# Patient Record
Sex: Female | Born: 1935 | Race: White | Hispanic: No | Marital: Single | Smoking: Former smoker
Health system: Southern US, Community
[De-identification: ages and names within clinical notes are randomized; demographics above are authoritative.]

## PROBLEM LIST (undated history)

## (undated) DIAGNOSIS — D649 Anemia, unspecified: Secondary | ICD-10-CM

## (undated) DIAGNOSIS — E119 Type 2 diabetes mellitus without complications: Secondary | ICD-10-CM

## (undated) DIAGNOSIS — K219 Gastro-esophageal reflux disease without esophagitis: Secondary | ICD-10-CM

## (undated) DIAGNOSIS — J449 Chronic obstructive pulmonary disease, unspecified: Secondary | ICD-10-CM

## (undated) DIAGNOSIS — T7840XA Allergy, unspecified, initial encounter: Secondary | ICD-10-CM

## (undated) DIAGNOSIS — M1990 Unspecified osteoarthritis, unspecified site: Secondary | ICD-10-CM

## (undated) DIAGNOSIS — I10 Essential (primary) hypertension: Secondary | ICD-10-CM

## (undated) HISTORY — PX: OTHER SURGICAL HISTORY: SHX169

## (undated) HISTORY — PX: ANTERIOR AND POSTERIOR VAGINAL REPAIR: SUR5

## (undated) HISTORY — PX: ABDOMINAL HYSTERECTOMY: SHX81

---

## 2004-01-26 ENCOUNTER — Ambulatory Visit: Payer: Self-pay | Admitting: Unknown Physician Specialty

## 2004-02-07 ENCOUNTER — Other Ambulatory Visit: Payer: Self-pay

## 2004-02-07 ENCOUNTER — Inpatient Hospital Stay: Payer: Self-pay | Admitting: Internal Medicine

## 2004-02-08 ENCOUNTER — Other Ambulatory Visit: Payer: Self-pay

## 2004-02-09 ENCOUNTER — Other Ambulatory Visit: Payer: Self-pay

## 2004-03-03 ENCOUNTER — Ambulatory Visit: Payer: Self-pay | Admitting: Unknown Physician Specialty

## 2004-05-11 ENCOUNTER — Ambulatory Visit: Payer: Self-pay | Admitting: Urology

## 2004-05-18 ENCOUNTER — Ambulatory Visit: Payer: Self-pay | Admitting: Urology

## 2004-06-10 ENCOUNTER — Ambulatory Visit: Payer: Self-pay | Admitting: Urology

## 2004-07-09 ENCOUNTER — Ambulatory Visit: Payer: Self-pay | Admitting: Internal Medicine

## 2004-07-19 ENCOUNTER — Ambulatory Visit: Payer: Self-pay | Admitting: Internal Medicine

## 2004-08-12 ENCOUNTER — Ambulatory Visit: Payer: Self-pay | Admitting: Ophthalmology

## 2004-08-16 ENCOUNTER — Ambulatory Visit: Payer: Self-pay

## 2004-08-18 ENCOUNTER — Ambulatory Visit: Payer: Self-pay | Admitting: Ophthalmology

## 2004-12-23 ENCOUNTER — Ambulatory Visit: Payer: Self-pay | Admitting: Unknown Physician Specialty

## 2005-03-10 ENCOUNTER — Ambulatory Visit: Payer: Self-pay | Admitting: Internal Medicine

## 2005-05-02 ENCOUNTER — Inpatient Hospital Stay: Payer: Self-pay | Admitting: Internal Medicine

## 2005-09-12 ENCOUNTER — Ambulatory Visit: Payer: Self-pay | Admitting: Internal Medicine

## 2005-09-21 ENCOUNTER — Ambulatory Visit: Payer: Self-pay

## 2006-06-15 ENCOUNTER — Other Ambulatory Visit: Payer: Self-pay

## 2006-06-15 ENCOUNTER — Emergency Department: Payer: Self-pay | Admitting: Emergency Medicine

## 2006-06-16 ENCOUNTER — Other Ambulatory Visit: Payer: Self-pay

## 2006-09-03 ENCOUNTER — Other Ambulatory Visit: Payer: Self-pay

## 2006-09-03 ENCOUNTER — Emergency Department: Payer: Self-pay | Admitting: Internal Medicine

## 2006-10-18 ENCOUNTER — Ambulatory Visit: Payer: Self-pay | Admitting: Internal Medicine

## 2006-12-19 ENCOUNTER — Ambulatory Visit: Payer: Self-pay | Admitting: Internal Medicine

## 2007-09-07 ENCOUNTER — Ambulatory Visit: Payer: Self-pay | Admitting: Surgery

## 2008-01-04 ENCOUNTER — Inpatient Hospital Stay: Payer: Self-pay | Admitting: Internal Medicine

## 2008-02-13 ENCOUNTER — Ambulatory Visit: Payer: Self-pay | Admitting: Gastroenterology

## 2009-05-03 ENCOUNTER — Emergency Department: Payer: Self-pay | Admitting: Emergency Medicine

## 2009-05-07 ENCOUNTER — Observation Stay: Payer: Self-pay | Admitting: Internal Medicine

## 2010-10-03 ENCOUNTER — Inpatient Hospital Stay: Payer: Self-pay | Admitting: Specialist

## 2010-12-28 ENCOUNTER — Ambulatory Visit: Payer: Self-pay | Admitting: Ophthalmology

## 2011-01-05 ENCOUNTER — Ambulatory Visit: Payer: Self-pay | Admitting: Ophthalmology

## 2011-05-20 ENCOUNTER — Emergency Department: Payer: Self-pay | Admitting: Emergency Medicine

## 2011-05-20 LAB — URINALYSIS, COMPLETE
Bilirubin,UR: NEGATIVE
Blood: NEGATIVE
Leukocyte Esterase: NEGATIVE
Nitrite: NEGATIVE
RBC,UR: NONE SEEN /HPF (ref 0–5)
Specific Gravity: 1.015 (ref 1.003–1.030)
WBC UR: <1 /HPF (ref 0–5)

## 2011-05-20 LAB — CBC
HCT: 38.2 % (ref 35.0–47.0)
HGB: 12.9 g/dL (ref 12.0–16.0)
MCHC: 33.8 g/dL (ref 32.0–36.0)
Platelet: 139 10*3/uL — ABNORMAL LOW (ref 150–440)
RBC: 4.18 10*6/uL (ref 3.80–5.20)
RDW: 13.6 % (ref 11.5–14.5)

## 2011-05-20 LAB — COMPREHENSIVE METABOLIC PANEL
Albumin: 4.1 g/dL (ref 3.4–5.0)
Alkaline Phosphatase: 76 U/L (ref 50–136)
Anion Gap: 8 (ref 7–16)
BUN: 22 mg/dL — ABNORMAL HIGH (ref 7–18)
Calcium, Total: 8.8 mg/dL (ref 8.5–10.1)
Chloride: 105 mmol/L (ref 98–107)
Co2: 29 mmol/L (ref 21–32)
Creatinine: 1.19 mg/dL (ref 0.60–1.30)
EGFR (African American): 57 — ABNORMAL LOW
Glucose: 98 mg/dL (ref 65–99)
Osmolality: 286 (ref 275–301)
SGPT (ALT): 19 U/L

## 2011-05-20 LAB — TROPONIN I: Troponin-I: <0.02 ng/mL

## 2011-05-20 LAB — TSH: Thyroid Stimulating Horm: 0.143 u[IU]/mL — ABNORMAL LOW

## 2012-03-15 ENCOUNTER — Emergency Department: Payer: Self-pay | Admitting: Internal Medicine

## 2012-03-15 LAB — CK TOTAL AND CKMB (NOT AT ARMC)
CK, Total: 50 U/L (ref 21–215)
CK-MB: 1 ng/mL (ref 0.5–3.6)

## 2012-03-15 LAB — CBC
HCT: 38.6 % (ref 35.0–47.0)
MCV: 89 fL (ref 80–100)
Platelet: 158 10*3/uL (ref 150–440)
RBC: 4.36 10*6/uL (ref 3.80–5.20)

## 2012-03-15 LAB — BASIC METABOLIC PANEL
BUN: 20 mg/dL — ABNORMAL HIGH (ref 7–18)
Creatinine: 1.29 mg/dL (ref 0.60–1.30)
EGFR (Non-African Amer.): 41 — ABNORMAL LOW
Osmolality: 281 (ref 275–301)
Potassium: 4.7 mmol/L (ref 3.5–5.1)

## 2012-03-15 LAB — HEPATIC FUNCTION PANEL A (ARMC)
Albumin: 4 g/dL (ref 3.4–5.0)
Bilirubin, Direct: 0.1 mg/dL (ref 0.00–0.20)
Bilirubin,Total: 0.4 mg/dL (ref 0.2–1.0)
SGPT (ALT): 16 U/L (ref 12–78)

## 2012-03-15 LAB — TROPONIN I: Troponin-I: <0.02 ng/mL

## 2012-03-21 LAB — CULTURE, BLOOD (SINGLE)

## 2012-07-13 ENCOUNTER — Inpatient Hospital Stay: Payer: Self-pay | Admitting: Family Medicine

## 2012-07-13 LAB — URINALYSIS, COMPLETE
Bacteria: NONE SEEN
Bilirubin,UR: NEGATIVE
Ketone: NEGATIVE
Leukocyte Esterase: NEGATIVE
Protein: NEGATIVE
RBC,UR: 1 /HPF (ref 0–5)
Specific Gravity: 1.021 (ref 1.003–1.030)
Squamous Epithelial: 1
WBC UR: 1 /HPF (ref 0–5)

## 2012-07-13 LAB — CK TOTAL AND CKMB (NOT AT ARMC)
CK, Total: 50 U/L (ref 21–215)
CK, Total: 37 U/L (ref 21–215)
CK-MB: 0.8 ng/mL (ref 0.5–3.6)

## 2012-07-13 LAB — TROPONIN I
Troponin-I: <0.02 ng/mL
Troponin-I: 0.02 ng/mL
Troponin-I: <0.02 ng/mL

## 2012-07-13 LAB — CBC
MCH: 31.1 pg (ref 26.0–34.0)
MCHC: 34.1 g/dL (ref 32.0–36.0)
MCV: 91 fL (ref 80–100)
RBC: 4.05 10*6/uL (ref 3.80–5.20)
WBC: 5.4 10*3/uL (ref 3.6–11.0)

## 2012-07-13 LAB — BASIC METABOLIC PANEL
Calcium, Total: 8.5 mg/dL (ref 8.5–10.1)
Co2: 29 mmol/L (ref 21–32)
Sodium: 143 mmol/L (ref 136–145)

## 2012-07-13 LAB — PROTIME-INR: INR: 1

## 2012-07-13 LAB — PRO B NATRIURETIC PEPTIDE: B-Type Natriuretic Peptide: 691 pg/mL — ABNORMAL HIGH (ref 0–125)

## 2012-07-14 LAB — CBC WITH DIFFERENTIAL/PLATELET
Basophil %: 0.2 %
Eosinophil #: 0 10*3/uL (ref 0.0–0.7)
Eosinophil %: 0 %
HGB: 10.6 g/dL — ABNORMAL LOW (ref 12.0–16.0)
MCV: 91 fL (ref 80–100)
Monocyte #: 0.3 x10 3/mm (ref 0.2–0.9)
Monocyte %: 2.4 %
Neutrophil #: 11.6 10*3/uL — ABNORMAL HIGH (ref 1.4–6.5)
Neutrophil %: 93.7 %
Platelet: 111 10*3/uL — ABNORMAL LOW (ref 150–440)
RDW: 14 % (ref 11.5–14.5)

## 2012-07-14 LAB — BASIC METABOLIC PANEL
Anion Gap: 3 — ABNORMAL LOW (ref 7–16)
Chloride: 109 mmol/L — ABNORMAL HIGH (ref 98–107)
Co2: 29 mmol/L (ref 21–32)
EGFR (Non-African Amer.): 47 — ABNORMAL LOW
Osmolality: 287 (ref 275–301)
Potassium: 4.4 mmol/L (ref 3.5–5.1)
Sodium: 141 mmol/L (ref 136–145)

## 2012-07-16 LAB — BASIC METABOLIC PANEL
Anion Gap: 6 — ABNORMAL LOW (ref 7–16)
BUN: 40 mg/dL — ABNORMAL HIGH (ref 7–18)
Calcium, Total: 8.8 mg/dL (ref 8.5–10.1)
Co2: 29 mmol/L (ref 21–32)
EGFR (African American): 43 — ABNORMAL LOW
EGFR (Non-African Amer.): 37 — ABNORMAL LOW
Glucose: 130 mg/dL — ABNORMAL HIGH (ref 65–99)

## 2012-07-17 LAB — BASIC METABOLIC PANEL
Anion Gap: 3 — ABNORMAL LOW (ref 7–16)
BUN: 43 mg/dL — ABNORMAL HIGH (ref 7–18)
Calcium, Total: 8.5 mg/dL (ref 8.5–10.1)
Chloride: 105 mmol/L (ref 98–107)
EGFR (African American): 41 — ABNORMAL LOW
Glucose: 199 mg/dL — ABNORMAL HIGH (ref 65–99)
Osmolality: 294 (ref 275–301)
Potassium: 3.9 mmol/L (ref 3.5–5.1)
Sodium: 139 mmol/L (ref 136–145)

## 2012-07-19 LAB — CULTURE, BLOOD (SINGLE)

## 2012-07-31 ENCOUNTER — Ambulatory Visit: Payer: Self-pay | Admitting: Physical Medicine and Rehabilitation

## 2012-10-31 ENCOUNTER — Emergency Department: Payer: Self-pay | Admitting: Emergency Medicine

## 2012-10-31 LAB — COMPREHENSIVE METABOLIC PANEL
Albumin: 3.6 g/dL (ref 3.4–5.0)
Anion Gap: 8 (ref 7–16)
BUN: 28 mg/dL — ABNORMAL HIGH (ref 7–18)
Bilirubin,Total: 0.2 mg/dL (ref 0.2–1.0)
Calcium, Total: 8.6 mg/dL (ref 8.5–10.1)
Chloride: 107 mmol/L (ref 98–107)
Creatinine: 1.66 mg/dL — ABNORMAL HIGH (ref 0.60–1.30)
EGFR (African American): 35 — ABNORMAL LOW
EGFR (Non-African Amer.): 30 — ABNORMAL LOW
Glucose: 142 mg/dL — ABNORMAL HIGH (ref 65–99)
Potassium: 4.1 mmol/L (ref 3.5–5.1)
SGOT(AST): 21 U/L (ref 15–37)
SGPT (ALT): 16 U/L (ref 12–78)
Sodium: 141 mmol/L (ref 136–145)
Total Protein: 6.4 g/dL (ref 6.4–8.2)

## 2012-10-31 LAB — TROPONIN I
Troponin-I: <0.02 ng/mL
Troponin-I: <0.02 ng/mL

## 2012-10-31 LAB — CK TOTAL AND CKMB (NOT AT ARMC): CK, Total: 54 U/L (ref 21–215)

## 2012-10-31 LAB — PRO B NATRIURETIC PEPTIDE: B-Type Natriuretic Peptide: 265 pg/mL — ABNORMAL HIGH (ref 0–125)

## 2012-10-31 LAB — CBC
HGB: 12.3 g/dL (ref 12.0–16.0)
MCH: 30.8 pg (ref 26.0–34.0)
RBC: 3.98 10*6/uL (ref 3.80–5.20)
WBC: 5.2 10*3/uL (ref 3.6–11.0)

## 2012-12-18 ENCOUNTER — Inpatient Hospital Stay: Payer: Self-pay | Admitting: Internal Medicine

## 2012-12-18 LAB — BASIC METABOLIC PANEL
BUN: 27 mg/dL — ABNORMAL HIGH (ref 7–18)
Calcium, Total: 9.6 mg/dL (ref 8.5–10.1)
Co2: 27 mmol/L (ref 21–32)
Osmolality: 287 (ref 275–301)
Potassium: 5 mmol/L (ref 3.5–5.1)

## 2012-12-18 LAB — CBC
HCT: 41.4 % (ref 35.0–47.0)
HGB: 13.9 g/dL (ref 12.0–16.0)
MCH: 30.5 pg (ref 26.0–34.0)
MCHC: 33.5 g/dL (ref 32.0–36.0)
WBC: 14.4 10*3/uL — ABNORMAL HIGH (ref 3.6–11.0)

## 2012-12-18 LAB — TROPONIN I: Troponin-I: <0.02 ng/mL

## 2012-12-19 LAB — CBC WITH DIFFERENTIAL/PLATELET
Basophil %: 0.3 %
Eosinophil #: 0.1 10*3/uL (ref 0.0–0.7)
Eosinophil %: 0.8 %
HCT: 29.2 % — ABNORMAL LOW (ref 35.0–47.0)
HGB: 10 g/dL — ABNORMAL LOW (ref 12.0–16.0)
Lymphocyte %: 8.8 %
MCH: 30.7 pg (ref 26.0–34.0)
MCV: 90 fL (ref 80–100)
Monocyte %: 6.2 %
Neutrophil %: 83.9 %
Platelet: 96 10*3/uL — ABNORMAL LOW (ref 150–440)
RDW: 13.6 % (ref 11.5–14.5)
WBC: 8.3 10*3/uL (ref 3.6–11.0)

## 2012-12-19 LAB — BASIC METABOLIC PANEL
Anion Gap: 4 — ABNORMAL LOW (ref 7–16)
Calcium, Total: 8.2 mg/dL — ABNORMAL LOW (ref 8.5–10.1)
Co2: 27 mmol/L (ref 21–32)
Creatinine: 1.77 mg/dL — ABNORMAL HIGH (ref 0.60–1.30)
EGFR (Non-African Amer.): 28 — ABNORMAL LOW
Glucose: 107 mg/dL — ABNORMAL HIGH (ref 65–99)
Osmolality: 280 (ref 275–301)
Sodium: 136 mmol/L (ref 136–145)

## 2012-12-19 LAB — URINALYSIS, COMPLETE
Bilirubin,UR: NEGATIVE
Blood: NEGATIVE
Ketone: NEGATIVE
Leukocyte Esterase: NEGATIVE
Protein: NEGATIVE
RBC,UR: <1 /HPF (ref 0–5)
Specific Gravity: 1.006 (ref 1.003–1.030)
Squamous Epithelial: 1
WBC UR: 2 /HPF (ref 0–5)

## 2012-12-20 LAB — CREATININE, SERUM
Creatinine: 1.62 mg/dL — ABNORMAL HIGH (ref 0.60–1.30)
EGFR (African American): 36 — ABNORMAL LOW

## 2012-12-20 LAB — CBC WITH DIFFERENTIAL/PLATELET
Basophil %: 0.6 %
HGB: 10.4 g/dL — ABNORMAL LOW (ref 12.0–16.0)
MCH: 30.9 pg (ref 26.0–34.0)
MCHC: 34.4 g/dL (ref 32.0–36.0)
MCV: 90 fL (ref 80–100)
Monocyte %: 5.8 %
Neutrophil %: 81.9 %
Platelet: 122 10*3/uL — ABNORMAL LOW (ref 150–440)

## 2012-12-22 LAB — MAGNESIUM: Magnesium: 1.6 mg/dL — ABNORMAL LOW

## 2012-12-23 LAB — CULTURE, BLOOD (SINGLE)

## 2013-01-25 ENCOUNTER — Ambulatory Visit: Payer: Self-pay | Admitting: Family

## 2013-03-02 ENCOUNTER — Observation Stay: Payer: Self-pay | Admitting: Internal Medicine

## 2013-03-02 LAB — CBC
HCT: 36.3 % (ref 35.0–47.0)
HGB: 12.5 g/dL (ref 12.0–16.0)
MCH: 30.2 pg (ref 26.0–34.0)
MCHC: 34.3 g/dL (ref 32.0–36.0)
MCV: 88 fL (ref 80–100)
PLATELETS: 140 10*3/uL — AB (ref 150–440)
RBC: 4.12 10*6/uL (ref 3.80–5.20)
RDW: 13.9 % (ref 11.5–14.5)
WBC: 5.5 10*3/uL (ref 3.6–11.0)

## 2013-03-02 LAB — COMPREHENSIVE METABOLIC PANEL
ALBUMIN: 3.7 g/dL (ref 3.4–5.0)
ALT: 16 U/L (ref 12–78)
Alkaline Phosphatase: 67 U/L
Anion Gap: 4 — ABNORMAL LOW (ref 7–16)
BUN: 24 mg/dL — ABNORMAL HIGH (ref 7–18)
Bilirubin,Total: 0.8 mg/dL (ref 0.2–1.0)
CHLORIDE: 104 mmol/L (ref 98–107)
Calcium, Total: 8.8 mg/dL (ref 8.5–10.1)
Co2: 30 mmol/L (ref 21–32)
Creatinine: 1.56 mg/dL — ABNORMAL HIGH (ref 0.60–1.30)
EGFR (African American): 37 — ABNORMAL LOW
EGFR (Non-African Amer.): 32 — ABNORMAL LOW
GLUCOSE: 92 mg/dL (ref 65–99)
OSMOLALITY: 279 (ref 275–301)
Potassium: 4 mmol/L (ref 3.5–5.1)
SGOT(AST): 9 U/L — ABNORMAL LOW (ref 15–37)
Sodium: 138 mmol/L (ref 136–145)
Total Protein: 6.6 g/dL (ref 6.4–8.2)

## 2013-03-02 LAB — TROPONIN I: Troponin-I: <0.02 ng/mL

## 2013-03-02 LAB — CK TOTAL AND CKMB (NOT AT ARMC)
CK, Total: 50 U/L (ref 21–215)
CK, Total: 64 U/L (ref 21–215)
CK-MB: 1.1 ng/mL (ref 0.5–3.6)
CK-MB: 0.9 ng/mL (ref 0.5–3.6)

## 2013-03-02 LAB — URINALYSIS, COMPLETE
BILIRUBIN, UR: NEGATIVE
Glucose,UR: NEGATIVE mg/dL (ref 0–75)
KETONE: NEGATIVE
Nitrite: NEGATIVE
PH: 5 (ref 4.5–8.0)
Protein: NEGATIVE
RBC,UR: 7 /HPF (ref 0–5)
Specific Gravity: 1.017 (ref 1.003–1.030)
WBC UR: 11 /HPF (ref 0–5)

## 2013-03-02 LAB — HEMOGLOBIN A1C: HEMOGLOBIN A1C: 5.8 % (ref 4.2–6.3)

## 2013-03-03 LAB — CBC WITH DIFFERENTIAL/PLATELET
BASOS ABS: 0.1 10*3/uL (ref 0.0–0.1)
BASOS PCT: 1.1 %
EOS ABS: 0.3 10*3/uL (ref 0.0–0.7)
Eosinophil %: 5.7 %
HCT: 34.8 % — ABNORMAL LOW (ref 35.0–47.0)
HGB: 12.1 g/dL (ref 12.0–16.0)
Lymphocyte #: 1.1 10*3/uL (ref 1.0–3.6)
Lymphocyte %: 21.9 %
MCH: 30.9 pg (ref 26.0–34.0)
MCHC: 34.6 g/dL (ref 32.0–36.0)
MCV: 89 fL (ref 80–100)
Monocyte #: 0.5 x10 3/mm (ref 0.2–0.9)
Monocyte %: 10.2 %
Neutrophil #: 3 10*3/uL (ref 1.4–6.5)
Neutrophil %: 61.1 %
Platelet: 126 10*3/uL — ABNORMAL LOW (ref 150–440)
RBC: 3.91 10*6/uL (ref 3.80–5.20)
RDW: 13.9 % (ref 11.5–14.5)
WBC: 5 10*3/uL (ref 3.6–11.0)

## 2013-03-03 LAB — CK TOTAL AND CKMB (NOT AT ARMC)
CK, TOTAL: 81 U/L (ref 21–215)
CK-MB: 1 ng/mL (ref 0.5–3.6)

## 2013-03-03 LAB — LIPID PANEL
Cholesterol: 136 mg/dL (ref 0–200)
HDL: 47 mg/dL (ref 40–60)
Ldl Cholesterol, Calc: 60 mg/dL (ref 0–100)
TRIGLYCERIDES: 146 mg/dL (ref 0–200)
VLDL CHOLESTEROL, CALC: 29 mg/dL (ref 5–40)

## 2013-03-03 LAB — BASIC METABOLIC PANEL
Anion Gap: 5 — ABNORMAL LOW (ref 7–16)
BUN: 24 mg/dL — ABNORMAL HIGH (ref 7–18)
CHLORIDE: 107 mmol/L (ref 98–107)
CO2: 27 mmol/L (ref 21–32)
Calcium, Total: 9 mg/dL (ref 8.5–10.1)
Creatinine: 1.51 mg/dL — ABNORMAL HIGH (ref 0.60–1.30)
EGFR (African American): 39 — ABNORMAL LOW
GFR CALC NON AF AMER: 33 — AB
GLUCOSE: 98 mg/dL (ref 65–99)
OSMOLALITY: 282 (ref 275–301)
Potassium: 3.6 mmol/L (ref 3.5–5.1)
SODIUM: 139 mmol/L (ref 136–145)

## 2013-03-03 LAB — TROPONIN I: Troponin-I: <0.02 ng/mL

## 2013-03-03 LAB — MAGNESIUM: Magnesium: 1.7 mg/dL — ABNORMAL LOW

## 2013-03-04 LAB — BASIC METABOLIC PANEL
Anion Gap: 5 — ABNORMAL LOW (ref 7–16)
BUN: 23 mg/dL — ABNORMAL HIGH (ref 7–18)
CALCIUM: 9.2 mg/dL (ref 8.5–10.1)
CO2: 29 mmol/L (ref 21–32)
Chloride: 104 mmol/L (ref 98–107)
Creatinine: 1.41 mg/dL — ABNORMAL HIGH (ref 0.60–1.30)
EGFR (African American): 42 — ABNORMAL LOW
GFR CALC NON AF AMER: 36 — AB
GLUCOSE: 105 mg/dL — AB (ref 65–99)
OSMOLALITY: 280 (ref 275–301)
Potassium: 3.5 mmol/L (ref 3.5–5.1)
Sodium: 138 mmol/L (ref 136–145)

## 2013-03-04 LAB — URINE CULTURE

## 2013-03-04 LAB — MAGNESIUM: Magnesium: 2 mg/dL

## 2013-09-20 ENCOUNTER — Emergency Department: Payer: Self-pay | Admitting: Emergency Medicine

## 2013-09-20 LAB — COMPREHENSIVE METABOLIC PANEL
ALK PHOS: 75 U/L
Albumin: 2.8 g/dL — ABNORMAL LOW (ref 3.4–5.0)
Anion Gap: 7 (ref 7–16)
BILIRUBIN TOTAL: 0.5 mg/dL (ref 0.2–1.0)
BUN: 21 mg/dL — ABNORMAL HIGH (ref 7–18)
Calcium, Total: 8.3 mg/dL — ABNORMAL LOW (ref 8.5–10.1)
Chloride: 110 mmol/L — ABNORMAL HIGH (ref 98–107)
Co2: 24 mmol/L (ref 21–32)
Creatinine: 1.49 mg/dL — ABNORMAL HIGH (ref 0.60–1.30)
EGFR (African American): 39 — ABNORMAL LOW
EGFR (Non-African Amer.): 34 — ABNORMAL LOW
GLUCOSE: 148 mg/dL — AB (ref 65–99)
Osmolality: 287 (ref 275–301)
Potassium: 4.6 mmol/L (ref 3.5–5.1)
SGOT(AST): 17 U/L (ref 15–37)
SGPT (ALT): 18 U/L
SODIUM: 141 mmol/L (ref 136–145)
Total Protein: 6.5 g/dL (ref 6.4–8.2)

## 2013-09-20 LAB — URINALYSIS, COMPLETE
Bacteria: NONE SEEN
Bilirubin,UR: NEGATIVE
Glucose,UR: NEGATIVE mg/dL (ref 0–75)
Hyaline Cast: 2
KETONE: NEGATIVE
Leukocyte Esterase: NEGATIVE
Nitrite: NEGATIVE
Ph: 5 (ref 4.5–8.0)
Protein: NEGATIVE
RBC,UR: 1 /HPF (ref 0–5)
SPECIFIC GRAVITY: 1.013 (ref 1.003–1.030)

## 2013-09-20 LAB — CBC
HCT: 36.2 % (ref 35.0–47.0)
HGB: 11.9 g/dL — AB (ref 12.0–16.0)
MCH: 30.9 pg (ref 26.0–34.0)
MCHC: 33 g/dL (ref 32.0–36.0)
MCV: 94 fL (ref 80–100)
PLATELETS: 128 10*3/uL — AB (ref 150–440)
RBC: 3.86 10*6/uL (ref 3.80–5.20)
RDW: 14.6 % — ABNORMAL HIGH (ref 11.5–14.5)
WBC: 10 10*3/uL (ref 3.6–11.0)

## 2013-09-20 LAB — LIPASE, BLOOD: LIPASE: 38 U/L — AB (ref 73–393)

## 2014-01-18 IMAGING — CR DG CHEST 1V PORT
1 series · 1 of 1 positions shown · non-contrast
Comparison: none

REASON FOR EXAM: Chest pain
COMMENTS:

PROCEDURE:     DXR - DXR PORTABLE CHEST SINGLE VIEW  - October 31, 2012  [DATE]
RESULT:
Comparison is made to a prior study dated 07/13/2012.

[ap]
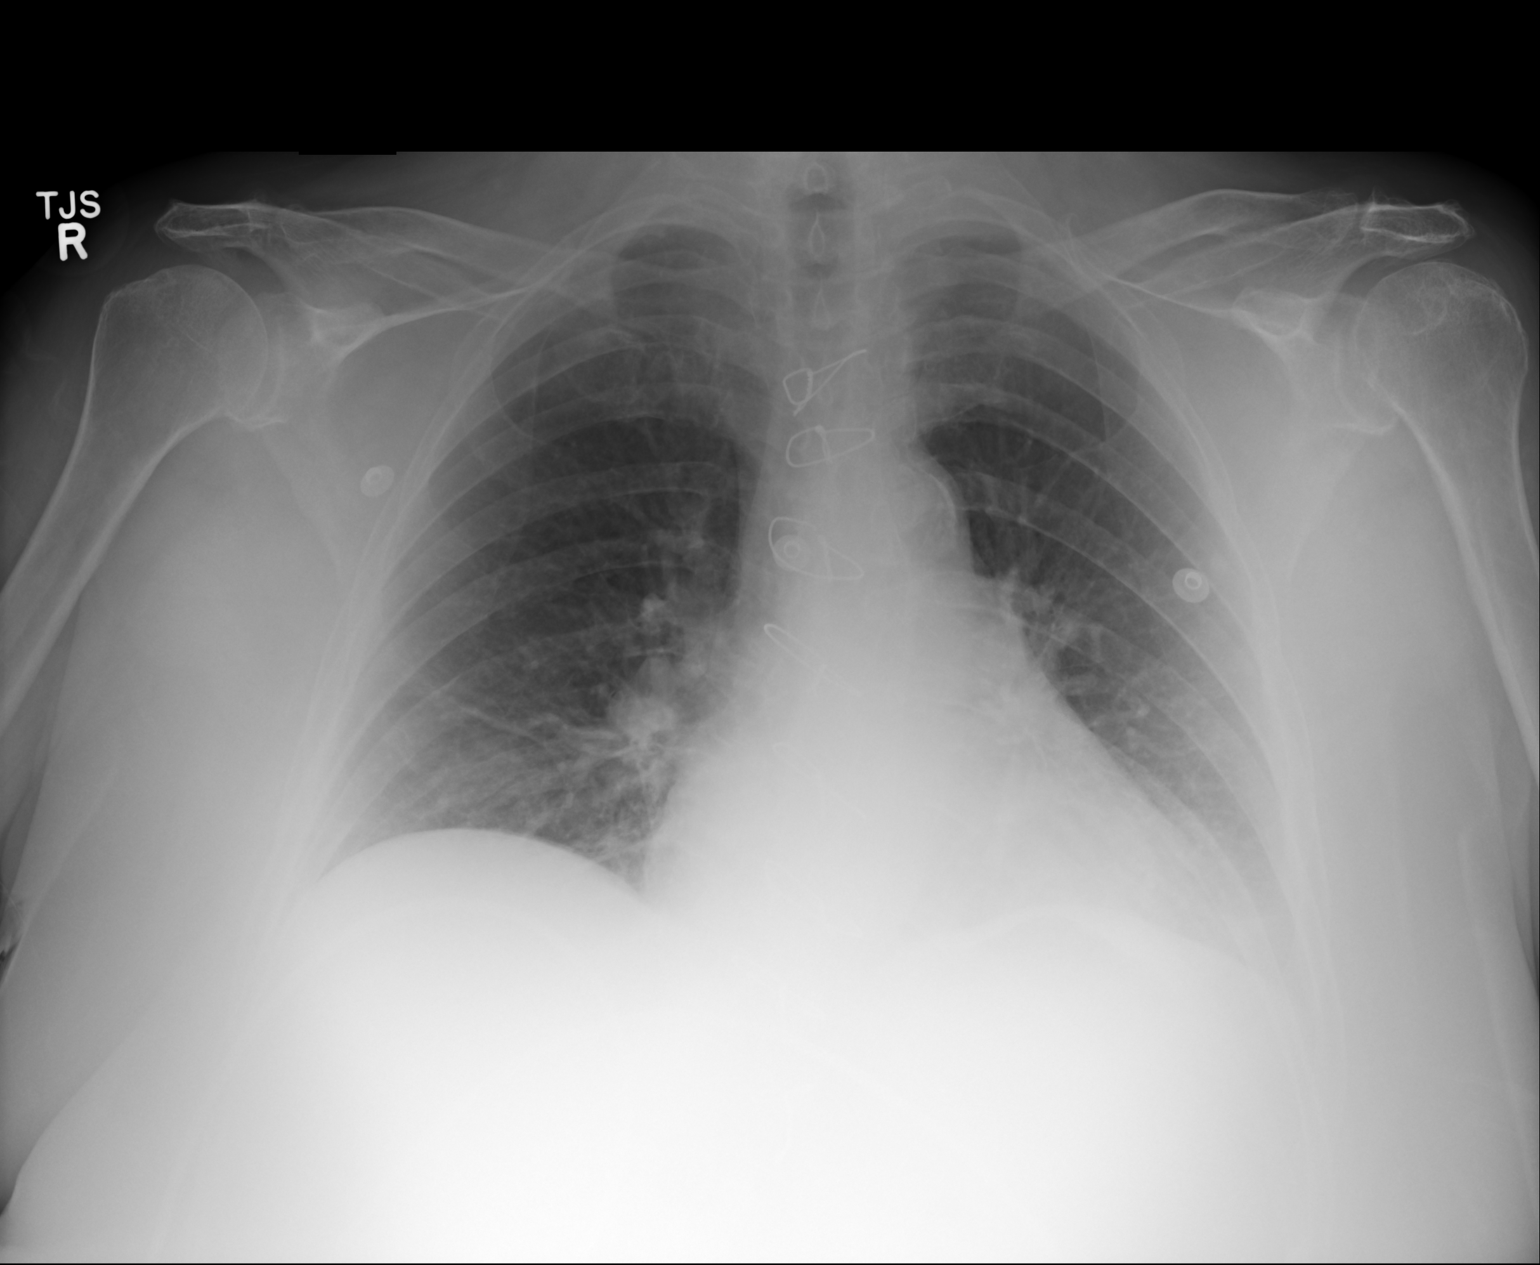

[1 of 1 positions shown; findings below may reference images not displayed]

FINDINGS: The patient has taken a shallow inspiration. With technique taken
into consideration, there is no evidence of focal infiltrates, effusions or
edema. The cardiac silhouette is within normal limits. The patient is status
post median sternotomy. The visualized bony skeleton is unremarkable.
IMPRESSION: Shallow inspiration without evidence of acute
cardiopulmonary disease.

## 2014-05-20 IMAGING — CR DG CHEST 1V PORT
1 series · 1 of 1 positions shown · non-contrast
Comparison: 12/22/2012

CLINICAL DATA: Chest pain

EXAM:
PORTABLE CHEST - 1 VIEW

[ap]
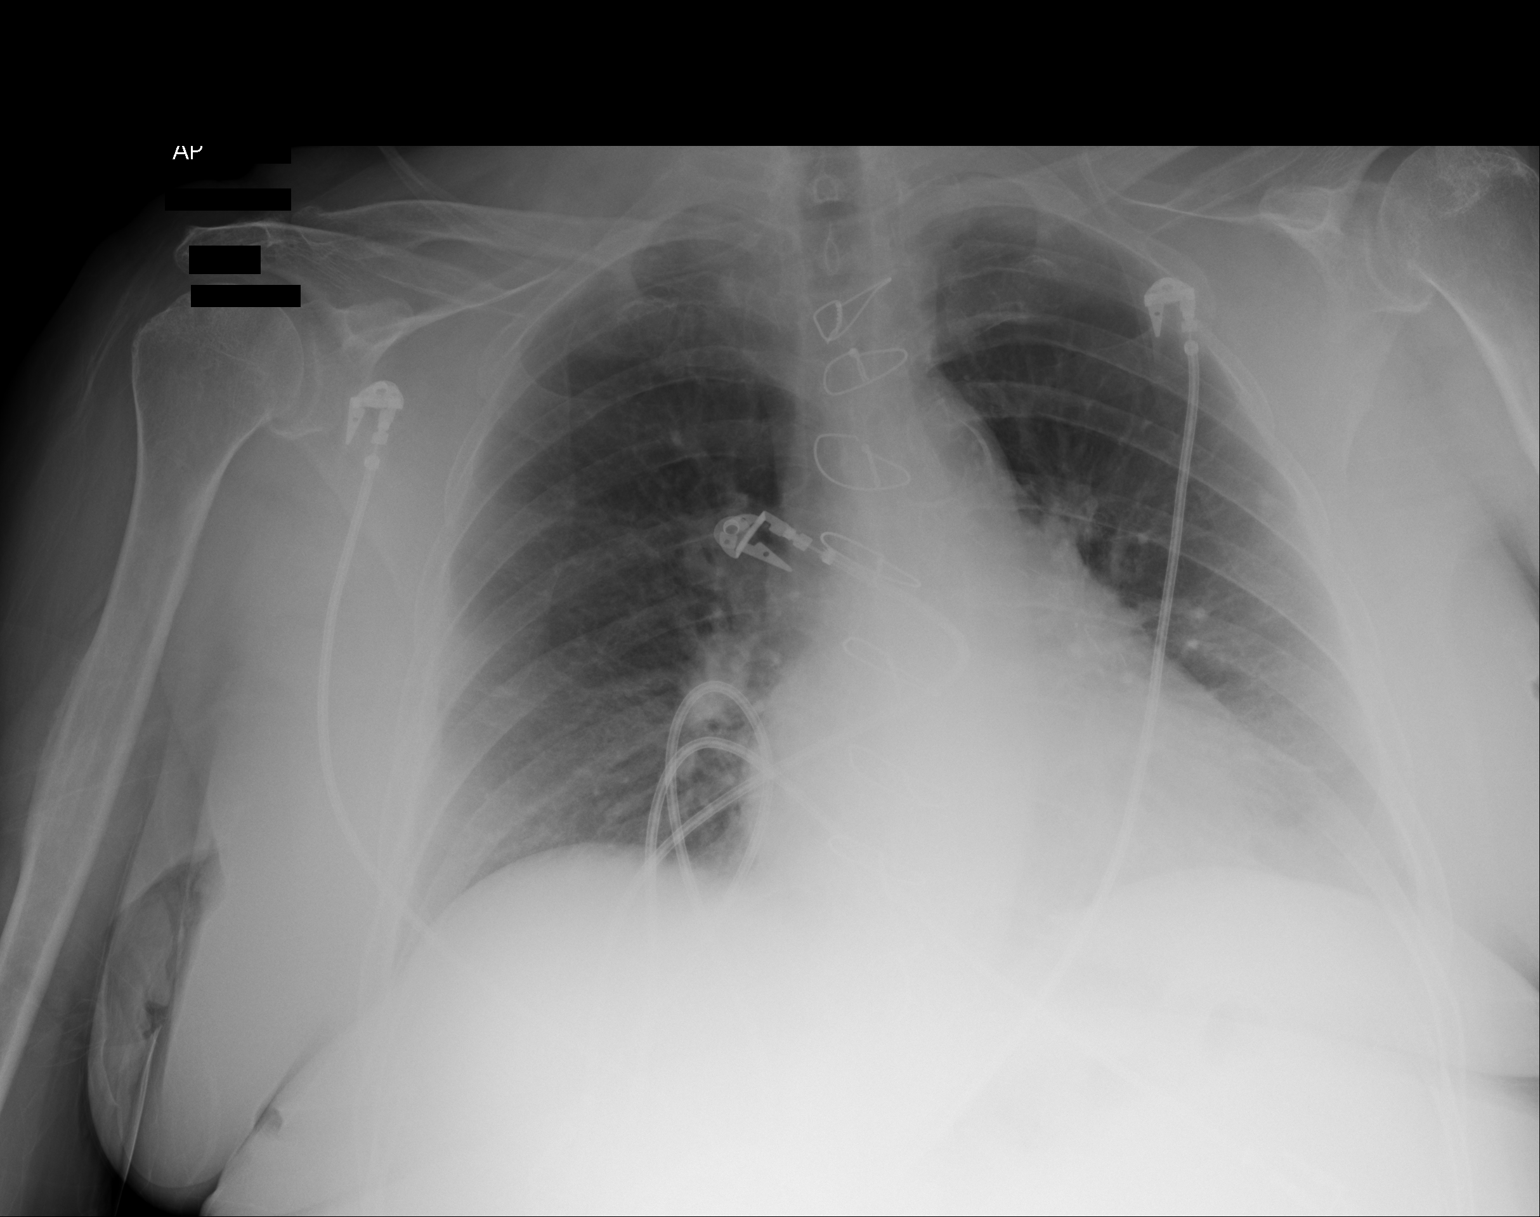

[1 of 1 positions shown; findings below may reference images not displayed]

FINDINGS: Cardiomediastinal silhouette is stable. Status post median
sternotomy. No acute infiltrate or pleural effusion. No pulmonary
edema.
IMPRESSION: No active disease.

## 2014-06-13 NOTE — Consult Note (Signed)
PATIENT NAME:  Melanie Wang, Melanie Wang MR#:  [id number] DATE OF BIRTH:  December 30, 1935  DATE OF CONSULTATION:  12/21/2012  REFERRING PHYSICIAN:  Bayard Males, MD CONSULTING PHYSICIAN:  Dwayne D. Callwood, MD  The patient also goes to [facility]  CHIEF COMPLAINT: Shortness of breath, atrial fibrillation.   HISTORY OF PRESENT ILLNESS: The patient is a 79 year old white female with past medical history of COPD not on home O2. She has coronary disease, diabetes, anemia, osteoarthritis, hypothyroidism, hypertension, hyperlipidemia, obstructive sleep apnea on CPAP with obesity who presented with shortness of breath, cough productive for green phlegm, and atypical chest pain.   The patient reports that she [facility] about a [facility] at senior center after she had been discharged home. The patient was complaining of chest pain on the right side with persistent musculoskeletal and pleuritic pain with shortness of breath. She tripped over something and did not lose consciousness. She had soreness in her arm and on the right side.   Chest x-ray in the Emergency Room suggested possible rib fracture with right middle lobe infiltrate, possible small pneumothorax. She was placed on Levaquin. Because she was hypoxic  was placed on oxygen as well and was admitted for further evaluation and care. Her creatinine was also elevated. EKG was nonspecific and troponin was negative. She subsequently had sudden rapid atrial fibrillation and cardiology was then consulted.   PAST MEDICAL HISTORY: Coronary artery disease, diabetes, anemia, osteoarthritis, COPD, hypothyroidism, obesity, irritable bowel syndrome, leukopenia, anxiety, obstructive sleep apnea.   PAST SURGICAL HISTORY: Coronary artery placement, hysterectomy, bladder tuck, ear surgery, right cataract surgery.   ALLERGIES: SULFA, FLAGYL, SHELLFISH, METFORMIN, ALEVE, SYNTHROID.   SOCIAL HISTORY: Quit smoking in 1995. The patient is a Jehovah's Witness and did not receive any  blood products.   FAMILY HISTORY: Asthma, CVA, cerebral hemorrhage.   MEDICATIONS: Aspirin 81 mg a day, Norco as needed p.r.n., Diovan 160 daily, Maalox p.r.n., sublingual nitroglycerin p.r.n., clonazepam 1 mg at bedtime, sertraline 100 mg twice a day, Pravachol 40 a day, and Toprol-XL 25 a day, DuoNebs every 4 hours p.r.n., Ventolin as needed, Spiriva 18 mcg inhaler daily, Lasix 20 a day, ranitidine 150 mg daily potassium 10 mEq  daily, Nexium 40 twice a day, levothyroxine 0.1 mg a day, Qvar 40 mcg inhaled 1 puff twice a day.   REVIEW OF SYSTEMS: Some shortness of breath, weakness, fatigue, right rib pain, cough, sputum production. Denies blackout spells or syncope. No nausea or vomiting. No fever. No chills. No sudden weight loss, no hematemesis. Denies bright red blood per rectum.   PHYSICAL EXAMINATION:  VITAL SIGNS: Blood pressure 140/70. Pulse 100, respiratory rate 16, afebrile. Oxygen saturation was 92% on 2 liters.  HEENT: Normocephalic, atraumatic. Pupils are equal and reactive to light.  NECK: Supple. No significant JVD, bruits, or adenopathy.  LUNGS: Clear to auscultation and percussion with mild rhonchi and dullness in the right lung.  HEART: Irregularly irregular. Systolic ejection murmur, left sternal border. Positive murmur.  ABDOMEN: Benign.  EXTREMITY: Within normal limits.  NEUROLOGIC: Intact.  SKIN: Normal.   LABORATORIES: Glucose 169, BUN 27, creatinine 1.6, sodium 139, potassium 5, chloride 107, CO2 27. White count 14.4, hemoglobin 13.9, hematocrit 41, platelet count 190. Troponin was negative.   Chest x-ray: Infiltrate, right lung haziness with volume loss and volume overload and possible infiltrate. Questionable rib fracture, may have had evidence of a small pneumothorax.   ASSESSMENT:  1.  Acute respiratory failure.  2.  Possible pneumonia.  3.  Hypoxemia.  4.  Rib fracture.  5.  Renal insufficiency.  6.  Hyperlipidemia.  7.  Hypothyroidism.  8.  Obstructive  sleep apnea.  9.  Coronary artery disease.  10.  History of hypertension.  11.  Atrial fibrillation.   PLAN: Agree with admit. Place on with supplemental oxygen. Continue inhalers for COPD. Continue pain control for right rib fracture and poor inspiration. Continue broad-spectrum antibiotics as well. Inhalers would be useful for obstructive sleep apnea. Continue CPAP.   For hypertension, continue current blood pressure management and control. Follow up cardiac enzymes. Follow up EKG. Follow up diabetes management. Continue current medications. Continue thyroid medications. Continue blood pressure control as well for atrial fibrillation. Continue rate management. I am in favor of short-term anticoagulation but I am not sure I am in favor of long-term anticoagulation in a patient who is reluctant to take blood products in case she has significant bleeding. I would treat the patient conservatively for now with aspirin and hopefully the atrial fibrillation will resolve once the infiltrate and pneumonia resolve. Continue conservative therapy with  aggressive pulmonary support.     ____________________________ Bobbie Stackwayne D. Juliann Paresallwood, MD ddc:np D: 12/21/2012 18:42:51 ET T: 12/21/2012 20:44:01 ET JOB#: [id number]  cc: Dwayne D. Juliann Paresallwood, MD, <Dictator> Alwyn PeaWAYNE D CALLWOOD MD ELECTRONICALLY SIGNED 01/22/2013 21:16

## 2014-06-13 NOTE — H&P (Signed)
PATIENT NAME:  Melanie Wang, Melanie Wang MR#:  [id number] DATE OF BIRTH:  [birth year]  DATE OF ADMISSION:  07/13/2012  REFERRING PHYSICIAN:  Dorothea Glassman, MD  PRIMARY CARE PHYSICIAN: Following at [facility].   CHIEF COMPLAINT: Shortness of breath.   HISTORY OF PRESENT ILLNESS: This is a 79 year old female with significant past medical history of COPD, not on any home oxygen, coronary artery disease, diabetes, anemia, osteoarthritis, hypothyroidism, hypertension, hyperlipidemia, obstructive sleep apnea on CPAP, who presents with complaints of shortness of breath, productive sputum, and cough. The patient reports he has been having shortness of breath for the last couple of days, but did get worse yesterday. She does not use oxygen at home for her COPD.  The patient has been reporting cough with green productive sputum. As well, she reports she has been having coughing significantly where she started to have chest pain, midsternal, related to cough and taking a deep breath.  The patient's chest x-ray did show right lower lung infiltrate. The patient had significant wheezing upon presentation where she was hypoxic at 88% where she required oxygen. Hospitalist service was requested to admit the patient for her acute respiratory failure and COPD exacerbation. As well, the patient was found to have worsening renal function with a creatinine of 1.49, which was essentially normal.  She appears to be on Diovan and on Lasix. The patient received 324 of aspirin in the ED, received DuoNebs x 3, and IV Solu-Medrol 125.   PAST MEDICAL HISTORY: 1.  Coronary artery disease.  2.  Diabetes.  3.  History of anemia.  4.  Osteoarthritis.  5.  COPD, not on any oxygen.  6.  Hypothyroidism.  7.  Probable interstitial cystitis.  8.  Irritable bowel syndrome.  9.  History of leukopenia.  10.  Anxiety.  11.  Obstructive sleep apnea, on CPAP.  12.  Hyperlipidemia.  13.  Hypertension.   PAST SURGICAL  HISTORY: 1.  Bilateral knee replacement.  2.  Hysterectomy.  3.  Bladder tuck.  4.  Ear surgery.  5.  Right cataract surgery.   ALLERGIES:  1.  SULFA. 2.  FLAGYL.  3.  SHELLFISH.  4.  METFORMIN.  5.  ALEVE.  6.  SYNTHROID, BUT TOLERATES LEVOXYL.   SOCIAL HISTORY: Quit smoking in 1995. Started smoking at age of 12. The patient is a TEFL teacher Witness and is not to be receiving any blood products.  FAMILY HISTORY: Significant for asthma in her sister and father had history of stroke and cerebral hemorrhage.   HOME MEDICATIONS: 1.  Aspirin 81 mg oral daily.  2.  Mobic 7.5 mg p.o. daily.  3.  Norco 5/325 mg every 6 hours as needed.  4.  Diovan 160 mg oral daily.  5.  Maalox 30 mL every 6 hours as needed.  6.  Sublingual nitroglycerin 0.4 as needed for chest pain.  7.  Clonazepam 1 mg oral at bedtime.  8.  Sertraline 100 mg oral 2 times a day.  9.  Pravachol 40 mg oral daily.  10.  Toprol-XL 25 mg oral daily.  11.  DuoNeb every 4 to 6 hours as needed.  12.  Ventolin as needed.  13.  Spiriva 18 mcg inhalation daily.  14.  Lasix 20 mg oral daily.  15.  Ranitidine 150 mg oral p.o. b.i.d.  16.  Potassium 10 mEq oral daily.  17.  Nexium 40 mg p.o. b.i.d.  18.  Vitamin D 250,000 international units 1 capsule every month.  19.  Macrobid 100 mg oral daily.  20.  Levothroid 0.1 mg oral daily.  21.  Q-var 40 mcg inhalation 1 puff 2 times a day.   REVIEW OF SYSTEMS: CONSTITUTIONAL: The patient denies fever, chills. Complains of fatigue, weakness.  EYES: Denies blurry vision, double vision. EARS, NOSE, THROAT: Denies tinnitus, ear pain, hearing loss, epistaxis, or discharge.  RESPIRATORY: Complains of cough with green productive sputum. Complains of wheezing, shortness of breath, and has history of COPD.  Denies any hemoptysis. Has painful respirations.  CARDIOVASCULAR: Has typical chest pain related to deep breath and cough. Denies edema, arrhythmia, palpitations, or syncope.   GASTROINTESTINAL: Denies nausea, vomiting, diarrhea, abdominal pain, hematemesis, or melena.  GENITOURINARY: Denies dysuria, hematuria, or renal colic.  ENDOCRINE: Denies polyuria or polydipsia, heat or cold intolerance.  HEMATOLOGY: Denies anemia, easy bruising, or bleeding diathesis.  INTEGUMENTARY: Denies acne, rash, or skin lesions.  MUSCULOSKELETAL: Denies any gout, redness, or cramps. Has history of arthritis.  NEUROLOGIC:  Denies CVA, TIA, seizures, focal deficits, or numbness.  PSYCHIATRIC: Denies substance abuse, alcohol abuse. Has history of depression and anxiety.   PHYSICAL EXAMINATION: VITAL SIGNS: Temperature 97.6, pulse 81, respiratory rate 24, blood pressure 112/43, and saturating 94% on room air. Was saturating 88% on room air.  GENERAL: Elderly female who is comfortable and in no apparent distress.  HEENT: Head is atraumatic, normocephalic.  Pupils equal and reactive to light. Pink conjunctivae. Anicteric sclerae. Moist oral mucosa.  NECK: Supple. No thyromegaly. No JVD.  CHEST: Fair air entry bilaterally with scattered wheezing and diffuse rhonchi.  CARDIOVASCULAR: S1 and S2 heard. No rubs, murmurs, or gallops.  ABDOMEN: Soft, nontender, and nondistended. Bowel sounds present.  EXTREMITIES: No edema. No clubbing. No cyanosis. Dorsalis pedis plus intact bilaterally.  SKIN: Normal skin turgor. Warm and dry.  PSYCHIATRIC: Appropriate affect. Awake and alert x 3. Intact judgment and insight.  NEUROLOGIC:  Grossly intact. No focal sensory or motor deficits.   PERTINENT LABORATORY AND DIAGNOSTICS:  Glucose 182. BNP 691. BUN 24, creatinine 1.45, sodium 143, potassium 3.6, chloride 109, and CO2 29. Troponin less than 0.02. White blood cell 5.4, hemoglobin 12.6, hematocrit 36.9, and platelets 114,000. INR 1. Urinalysis negative for leukocyte esterase and nitrite.   EKG showing normal sinus rhythm at 78 beats per minute with right bundle branch block.   ASSESSMENT AND PLAN: 1.   Acute respiratory failure.  Appears to be hypoxic respiratory failure secondary to chronic obstructive pulmonary disease and pneumonia. The patient will be started kept on oxygen to target oxygen saturation of 95%.  2.  Chronic obstructive pulmonary disease exacerbation. The patient will be started on 60 of Solu-Medrol IV every 8 hours, DuoNeb every 4 hours, and albuterol as needed. Given the fact she is having productive sputum, she will be started on Levaquin. As well, she will be continued on Spiriva and Q-Var.  3.  Pneumonia. The patient has right lower lobe opacity. She will be continued on Levaquin. Will follow on the blood cultures. 4.  Acute renal failure. Will hold Lasix. Will hold Diovan. We will continue with gentle hydration.  5.  Chest pain. Appears to be atypical. Exacerbated with cough and deep breaths. The patient received 324 of aspirin in the ED.  Will continue to cycle cardiac enzymes and follow the trend.  6.  Obstructive sleep apnea. The patient is on CPAP at bedtime. Will continue with that.  7.  Hypertension. Blood pressure acceptable. Continue home meds.  8.  Hyperlipidemia. Continue with  statin.  9.  Hypothyroidism. Continue with levothyroxine.  10.  History of coronary artery disease. The patient will be continued on her aspirin and beta blockers, and when renal failure improves will resume her back on Diovan and will continue her on statin.  11.  Deep vein thrombosis prophylaxis. Subcutaneous heparin.   CODE STATUS: The patient reports she is DO NOT RESUSCITATE and she has a living will at home. She will have her daughter bring it.  TIME SPENT ON ADMISSION AND PATIENT CARE: 60 minutes.  ____________________________ Starleen Armsawood S. Rabon Scholle, MD dse:sb D: 07/13/2012 09:09:17 ET T: 07/13/2012 09:38:34 ET JOB#: [id number]  cc: Starleen Armsawood S. Tniya Bowditch, MD, <Dictator> Tramya Schoenfelder Teena IraniS Alante Tolan MD ELECTRONICALLY SIGNED 07/14/2012 4:27

## 2014-06-13 NOTE — Discharge Summary (Signed)
PATIENT NAME:  Melanie Wang, Melanie Wang MR#:  [id number] DATE OF BIRTH:  Aug 19, 1935  DATE OF ADMISSION:  07/13/2012 DATE OF DISCHARGE:  07/17/2012  REASON FOR ADMISSION: Shortness of breath.   DISCHARGE DIAGNOSES: 1.  Acute respiratory failure.  2.  Chronic obstructive pulmonary disease.  3.  Congestive heart failure exacerbation, low-grade diastolic dysfunction, acute ejection fraction of 55%.  4.  The patient did not have pneumonia despite the fact this was a suspicion on admission.  5.  Hyperglycemia due to steroids.  6.  Increased white blood cells due to steroids.  7.  Acute renal failure.  8.  Atypical chest pain.  9.  Obstructive sleep apnea, on CPAP.  10.  Hypertension.  11.  Hyperlipidemia.  12.  Hypothyroidism.  13.  History of coronary artery disease.   DISCHARGE MEDICATIONS: 1.  Clonazepam 1 mg once a day. 2.  Sertraline 100 mg twice daily. 3.  Diovan 160 mg once a day. 4.  Toprol-XL 25 mg once a day. 5.  Spiriva 18 mcg once a day. 6.  Nitroglycerin 0.4 mg as needed for chest pain. 7.  Q-Var 40 mcg inhaled 1 puff twice daily. 8. colace 100 mg once a day. 9.  Nexium 40 mg twice daily. 10.  Ventolin HFA 90 mcg take 2 puffs 4 times a day. 11.  Pravachol 40 mg once a day. 12.  Levothroid 100 mcg once a day. 13.  Aspirin 81 mg once a day. 14.  Potassium chloride 10 mEq once daily. 15.  Vitamin D2 50,000 international units every first Monday of the month.  16.  Norco 5/325 mg every 6 hours as needed for pain. 17.  Cheratussin AC 10 mg/100 mg as needed for cough. 18.  Maalox maximum strength every 6 hours as needed for heartburn. 19.  Ranitidine 150 mg twice daily. 20.  DuoNebs every 4 to 6 hours as needed for shortness of breath.  21.  Prednisone taper.  22.  Furosemide 20 mg once a day. Take an extra 20 if weight gain more than 2 pounds in a day or more than 5 pounds in a week. 23.  Guaifenesin 600 mg twice daily. 24.  Levofloxacin 250 mg every 24 hours for 5 days for COPD  exacerbation.  25.  Lactobacillus 1 tablet twice a day for 10 days to prevent diarrhea.   DISCHARGE FOLLOWUP: With [facility] Sh[facility]Brooks, [facility] in the next 1 to 2 weeks.   LABORATORY AND DIAGNOSTICS:  Important results:  Creatinine 1.45 at admission, dropped to 1.14, at discharge 1.44. Glucose in between 130 to 182. Potassium was within normal limits, 3.9 at discharge. BUN 43 at discharge. Troponin and cardiac enzymes were negative x 3. White count at discharge 12.4, hemoglobin 10.6, platelet count 111 at discharge. INR 1.0.  Blood cultures: No growth x 2. UA normal without signs of infection.   EKG: Normal sinus rhythm with occasional premature atrial complexes.   Chest x-ray:  Findings consistent with low-grade CHF.   HOSPITAL COURSE: Melanie Wang is a very nice 79 year old female with history of COPD, diastolic heart failure, not on oxygen at home. She also has coronary artery disease, diabetes, osteoarthritis, hypothyroidism, hypertension, hyperlipidemia, obstructive sleep apnea, on CPAP, came to the hospital complaining of productive sputum and cough reporting that she was having shortness of breath for the last couple of days, got really worse, and she decided to come to the ER. On presentation, the patient had hypoxic respiratory failure with oxygen  saturation of 88% for what she was put on oxygen. Her creatinine on admission was 149 for what her Diovan and Lasix were held. The patient was given Solu-Medrol and nebs and started on Levaquin.   AS PER PROBLEMS: 1.  Acute respiratory failure, hypoxemic, secondary to COPD and also CHF exacerbation. It was mentioned the patient could have pneumonia, although the patient did not have any white count and no infiltrates on her chest x-ray for what the diagnosis was ruled out.  2.  As far as her COPD, the patient has an exacerbation. She was started on Solu-Medrol IV.  Solu-Medrol was decreased in the dose during the hospitalization. The  patient was able to come off of oxygen, starting to breathe a little bit better with less to the point that her respiratory sounds were clear by the time of discharge. The patient was discharged on a prednisone taper.  3.  CHF exacerbation of diastolic dysfunction. Her ejection fraction is 55%. The patient was given Lasix. At the beginning was held due to increased creatinine, but her creatinine at baseline should be somewhere around 1.2 to 1.4. At this moment, the patient was discharged on 20 mg a day. She was advised to take an extra tablet if there was an increase in her weight more than 2 pounds in a day or more than 5 pounds in a week. A scale was given to the patient as she did not have one.  The patient to continue beta blocker and an ARB.   4.  Acute kidney injury, likely due to the patient's primary complaint, which was acute respiratory failure and COPD exacerbation, not drinking enough fluids. Although she went into CHF, those medications and Lasix were held due to acute kidney injury, but they were  restarted after diet improved.  5.  As far as chest pain, she had some atypical chest pain brought on by cough and sputum, which resolved during her hospitalization. Troponins were negative x 3.  6.  Obstructive sleep apnea. The patient recommended to continue use BiPAP at bedtime. Other medical problems were stable during this hospitalization.   TIME SPENT: About 45 minutes discharging this patient on the day of discharge.  ____________________________ Felipa Furnace, MD rsg:sb D: 07/20/2012 13:06:31 ET T: 07/20/2012 15:09:59 ET JOB#: [id number]  cc: Felipa Furnace, MD, <Dictator> Sumaiya Arruda Juanda Chance MD ELECTRONICALLY SIGNED 07/30/2012 1:42

## 2014-06-13 NOTE — H&P (Signed)
PATIENT NAME:  Melanie Wang, Melanie Wang MR#:  [id number] DATE OF BIRTH:  11-May-1935  DATE OF ADMISSION:  12/18/2012  REFERRING PHYSICIAN: Enedina Finnerandolph N. Manson PasseyBrown, MD  PRIMARY CARE PHYSICIAN: The patient is following at [facility].   CHIEF COMPLAINT: Cough, shortness of breath.   HISTORY OF PRESENT ILLNESS: This is a 79 year old female with significant past medical history of COPD, not on home oxygen, coronary artery disease, diabetes, anemia, osteoarthritis, hypothyroidism, hypertension, hyperlipidemia, obstructive sleep apnea, on CPAP. The patient presents with complaints of shortness of breath, cough, productive green sputum and atypical chest pain. The patient reports she had a fall last week, where she went to [facility], where she was discharged home. The patient has been complaining of chest pain and right side musculoskeletal since then, and yesterday she developed cough, productive sputum and shortness of breath. The patient reports she had a mechanical fall where she tripped on something, she does not recall what, and she denies any head trauma. The patient denies any nausea, vomiting, sweating, weakness, dizziness or palpitations. The patient's chest x-ray does show a rib fracture and right middle lung infiltrate. The patient was started on Levaquin in the ED and was hypoxic, where she required oxygen. Hospitalist service was requested to admit the patient for further management and treatment of her respiratory failure and pneumonia. The patient's creatinine was found to be elevated at 1.6. Her baseline creatinine around 1.5. The last value we have is 1.66. The patient has negative troponin. No EKG changes. Had mild leukocytosis at 14.4 thousand.   PAST MEDICAL HISTORY:  1. Coronary artery disease.  2. Diabetes.  3. History of anemia.  4. Osteoarthritis.  5. COPD, not on oxygen.  6. Hypothyroidism.  7. Irritable bowel syndrome.  8. Leukopenia.  9. Anxiety.  10.  Obstructive sleep apnea, on CPAP.  11. Hyperlipidemia.  12. Hypertension.   PAST SURGICAL HISTORY:  1. Bilateral knee replacement.  2. Hysterectomy.  3. Bladder tuck. 4. Ear surgery.  5. Right cataract surgery.   ALLERGIES:  1. SULFA.  2. FLAGYL.  3. SHELLFISH.  4. METFORMIN.  5. ALEVE.  6. SYNTHROID, BUT TOLERATES LEVOXYL.   SOCIAL HISTORY: Quit smoking in 1995, started smoking at the age of 79. 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   FAMILY HISTORY: Significant for asthma in her sister, and father has history of CVA and cerebral hemorrhage in the family.   HOME MEDICATIONS: The patient currently does not have her medication list with her. Most recent medication we have is from ER visit in September of this year, and it is as follows: 1. Aspirin 81 mg daily.  2. Norco as needed.  3. Diovan 160 daily.  4. Maalox 30 mL every 6 hours as needed.  5. Sublingual nitroglycerin as needed.  6. Clonazepam 1 mg at bedtime.  7. Sertraline 100 mg oral 2 times a day.  8. Pravachol 40 mg daily.  9. Toprol-XL 25 mg daily.  10. DuoNebs every 4 to 6 hours as needed.  11. Ventolin as needed.  12. Spiriva 18 mcg inhalation daily.  13. Lasix 20 mg daily.  14. Ranitidine 150 mg b.i.d.  15. Potassium 10 mEq oral daily.  16. Nexium 40 mg b.i.d.  17. Levothroid 0.1 mg oral daily.  18. QVAR 40 mcg inhalational 1 puff 2 times a day.   REVIEW OF SYSTEMS:  CONSTITUTIONAL: The patient denies fever, chills. Complains of generalized weakness,  fatigue. Denies weight gain, weight loss.  EYES: Denies blurry vision, double vision, inflammation.  ENT: Denies tinnitus, ear pain, hearing loss, epistaxis.  RESPIRATORY: Complains of cough, productive sputum, green in color. Denies any wheezing. Did report COPD.  CARDIOVASCULAR: Has musculoskeletal chest pain. Denies edema, arrhythmia, palpitations, syncope.  GASTROINTESTINAL: Denies nausea, vomiting, diarrhea,  abdominal pain, hematemesis, melena.  GENITOURINARY: Denies dysuria, hematuria, renal colic.  ENDOCRINE: Denies polyuria, polydipsia, heat or cold intolerance.  HEMATOLOGY: Denies anemia, easy bruising, bleeding diathesis.  INTEGUMENTARY: Denies acne, rash or skin lesions.  MUSCULOSKELETAL: Complains of right ribcage pain. Reports history of arthritis. Denies any gout or cramps.  NEUROLOGIC: Denies CVA, TIA, headache, ataxia, tremors.  PSYCHIATRIC: Denies anxiety, insomnia, schizophrenia.   PHYSICAL EXAMINATION:  VITAL SIGNS: Temperature 99.1, pulse 109, respiratory rate 22, blood pressure 139/62, saturating 91% on 40% Ventimask.  GENERAL: Elderly female who looks comfortable in bed, in no apparent distress.  HEENT: Head is atraumatic, normocephalic. Pupils equally reactive to light. Pink conjunctivae. Anicteric sclerae. Dry oral mucosa.  NECK: Supple. No thyromegaly. No JVD.  CHEST: Fair air entry bilaterally. No wheezing. Has significant rales and rhonchi in the right lung. Has significant tenderness to palpation in the right chest.  CARDIOVASCULAR: S1, S2 heard. No rubs, murmur or gallops.  ABDOMEN: Soft, nontender, nondistended. Bowel sounds present.  EXTREMITIES: No edema. No clubbing. No cyanosis. Dorsalis pedis pulse felt bilaterally.  SKIN: Normal skin turgor. Warm and dry.  PSYCHIATRIC: Appropriate affect. Awake, alert x3. Intact judgment and insight.  NEUROLOGIC: Cranial nerves grossly intact. Motor 5 out of 5. No focal deficits.  LYMPHATICS: No cervical or supraclavicular lymphadenopathy.   PERTINENT LABORATORY DATA: Glucose 169, BUN 27, creatinine 1.6, sodium 139, potassium 5, chloride 107, CO2 27. White blood cells 14.4, hemoglobin 13.9, hematocrit 41.4, platelets 190. Troponin less than 0.02.   IMAGING: Chest x-ray showing significant right lung haziness, volume overload versus infiltrate, and questionable ribcage fracture. Still await official reading though.   ASSESSMENT  AND PLAN:  1. Acute respiratory failure, appears to be hypoxic respiratory failure. Currently, the patient on a 40% Ventimask. This is most likely related to her pneumonia. Will keep her on oxygen. Will keep O2 saturation less than 94 as she is known to have history of chronic obstructive pulmonary disease.  2. Pneumonia, right middle lung opacity. The patient will be started on Levaquin. Will follow on sputum cultures. This is most likely due to rib fracture.  3. Rib fracture. The patient appears to be having atypical chest pain due to her right rib fracture. Will have her on incentive spirometry and p.r.n. Percocet and morphine for her pain control. 4. Renal failure, appears to be at baseline. Will hold Lasix and Diovan. Will continue on gentle hydration.  5. Hyperlipidemia. Continue with statin.  6. Hypothyroidism. Continue with Synthroid.  7. Hypertension. Blood pressure acceptable. Continue with metoprolol.  8. Obstructive sleep apnea. Continue with CPAP.  9. Deep vein thrombosis prophylaxis. Subcutaneous heparin.   CODE STATUS: The patient reports she is do not resuscitate, reports she has a living will at home which states that. 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   TOTAL TIME SPENT ON ADMISSION AND PATIENT CARE: 55 minutes.   ____________________________ Starleen Arms, MD dse:lb D: 12/18/2012 08:01:24 ET T: 12/18/2012 08:32:33 ET JOB#: [id number]  cc: Starleen Arms, MD, <Dictator> Aking Klabunde Teena Irani MD ELECTRONICALLY SIGNED 12/19/2012 7:08

## 2014-06-13 NOTE — Discharge Summary (Signed)
PATIENT NAME:  Melanie Wang, Melanie Wang MR#:  [id number] DATE OF BIRTH:  1935/08/09  DATE OF ADMISSION:  12/18/2012 DATE OF DISCHARGE:  12/22/2012   ADMISSION DIAGNOSES:  1.  Acute respiratory failure.  2.  Pneumonia.  3.  Chronic obstructive pulmonary disease.   DISCHARGE DIAGNOSES: 1.  Acute respiratory failure secondary to community-acquired pneumonia.  2.  Severe sepsis on admission.  3.  Atrial fibrillation/atrial flutter with rapid ventricular rate, resolved.  4.  History of chronic obstructive pulmonary disease. 5.  Acute on chronic renal failure. 6.  Possible rib fracture with resolved small pneumothorax. 7.  Hypothyroidism.  8.  Low platelets.   CONSULTATIONS: Dr. Juliann Paresallwood.   PERTINENT LABORATORY AND RADIOLOGICAL DATA AT DISCHARGE: Chest x-ray on discharge shows no evidence of a pneumothorax.   Magnesium was 1.6, which was repleted. White blood cells 8.5, hemoglobin 10.4, hematocrit 31; platelets are 122.   Urine culture shows mixed bacteria.   Blood culture shows no growth. Legionella was negative.   HOSPITAL COURSE: This is a 79 year old female who presented with acute respiratory failure, found to have a pneumonia. For further details, please refer to the H and P.  1.  Acute respiratory failure secondary to community-acquired pneumonia: The patient was started on antibiotics, treated symptomatically for her acute respiratory failure with oxygen. Her oxygen has been weaned off. She has been afebrile. Blood cultures are negative. Her pneumonia has improved.  2.  Severe sepsis on arrival: The patient had fever, leukocytosis, pneumonia as well as acute respiratory failure. Blood cultures are negative to date. She has improved.  3.  Atrial fibrillation and flutter: The patient went into RVR with A. fib on 12/21/2012. She was given a dose of metoprolol, converted to normal sinus rhythm. Dr. Juliann Paresallwood was consulted. At this time, the patient did not want any anticoagulation. They  recommended aspirin, as the patient did not want did take short-term anticoagulation. As mentioned, she is now normal sinus rhythm.  4.  History of COPD: Not exacerbation. The patient will continue her outpatient inhalers and nebulizers.  5.  Acute on chronic renal failure: Initially, Lasix and Diovan were held. Her creatinine improved. Her baseline is probably stage III with a creatinine of 1.6 to 1.65. Will continue her Lasix and Diovan.  6.  Possible rib fracture: The patient had a chest x-ray, which showed possible rib fracture. She is on incentive spirometer. Her initial chest x-ray showed a small pneumothorax. On repeat chest x-ray, no mention of pneumothorax. The patient is off of oxygen, doing well. Pain is controlled.  7.  Hypothyroidism: On Synthroid.  8.  Low platelets: All 3 lines went down. Possibly it is dilutional. The patient does have a history of low platelets, and we held heparin due to her low platelets but they were all stable.   DISCHARGE MEDICATIONS: 1.  Levaquin 750 mg q.48 hours x 3 more days.  2.  Diovan 160 mg daily.  3.  Metoprolol 25 mg daily.  4.  Spiriva 18 mcg daily.  5.  Macrobid 100 mg daily.  6.  Ventolin 2 puffs 4 times a day as needed.  7.  Pravachol 40 mg daily.  8.  Levothroid 100 mcg daily.  9.  Aspirin 81 mg daily.  10.  Vitamin D2, 50,000 international units daily.  11.  Norco 5/325, 1 tablet q.6 hours p.r.n. pain.  12.  Maalox 30 mL q.6 hours p.r.n.  13.  Ranitidine 150 b.i.d.  14.  DuoNebs 3 mL q.4-6 hours  p.r.n.  15.  Bacitracin topical to affected area daily.  16.  Zofran 4 mg q.8 hours p.r.n. nausea.  17.  Zoloft 100 mg 1-1/2 tablets daily.  18.  Advair 250/50 b.i.d.  19.  Magic mouthwash 1 to 2 times a day as needed.  20.  Claritin 10 mg daily as needed.  21.  Cheratussin 5 mL q.6 hours p.r.n. cough.  22.  Dulcolax suppository as needed.  23.  Imodium 2 tablets as needed at the onset of diarrhea.  24.  Protonix 40 mg b.i.d.  25.  Mobic  7.5 mg daily.  26.  Ventolin HFA 2 puffs in the morning with Spiriva.  27.  Lasix 20 mg daily.  28.  Accolate 20 mg b.i.d.  29.  Tylenol 325, 2 tablets q.6 hours p.r.n.  30.  Amitiza 8 mcg daily.  31.  Clonazepam 1 mg b.i.d.  32.  Nitroglycerin sublingual p.r.n. chest pain.  33.  Lasix 20 mg p.r.n. for weight gain of greater than 2 pounds in 1 day or greater than 5 pounds in 1 week.   DISCHARGE HOME HEALTH: With PT, nurse and nurse aide.   DISCHARGE DIET: Low-sodium.  DISCHARGE: The patient does have [facility]. She will continue with the [facility] program. The patient is medically stable for discharge.  PT recommended SNF, however patient did not want to go to SNF. She insisted on discharge home and she was capable of making her decisions. She was discharged with home health  TIME SPENT: Approximately 35 minutes.   ____________________________ Janyth Contes. Juliene Pina, MD spm:jm D: 12/22/2012 12:39:01 ET T: 12/22/2012 15:35:05 ET JOB#: [id number]  cc: Nihal Doan P. Juliene Pina, MD, <Dictator> [facility] Irbin Fines P Sabrine Patchen MD ELECTRONICALLY SIGNED 12/22/2012 20:38

## 2014-06-14 NOTE — H&P (Signed)
PATIENT NAME:  Melanie Wang, Melanie M MR#:  [id number] DATE OF BIRTH:  1935/06/07  DATE OF ADMISSION:  03/02/2013  [facility]   CARDIOLOGIST:  Dr. Gwen PoundsKowalski   HISTORY OF PRESENT ILLNESS:  The patient is a 79 year old Caucasian female who had severe sepsis in November 2014. At that time she had AFib, atrial flutter with RVR. Also history of COPD, coronary artery disease, with most recent cardiac catheterization in August 2012, presents to the hospital with complaints of chest pains. According to patient, she was doing well up until today when she tried to get an extension cord untangled. She was pulling and jerking across the deck trying to get it untangled, and suddenly started having chest pains. The pain was described as midsternal, severe chest pain, sudden, 10/10 intensity and constant pain, with no significant change when she was walking or taking deep breaths. It lasted approximately 1-1/2 hours. She took nitroglycerin at home, and then one more nitroglycerin was given at fire department where her daughter took her. However, patient still was having chest pains, and she was brought to the Emergency Room for further evaluation by EMS. EMS gave her also 324 mg of aspirin, but no nitroglycerin was given since her blood pressure was down to 106 on arrival to the Emergency Room.   She also noted radiation of pain into her right side of the neck as well as right ear area, and she was somewhat short of breath. She was brought to the Emergency Room for further evaluation. EKG in the Emergency Room was about the same as it was in the past with right bundle branch block, T depressions in anterior lateral leads. However, that did not change since prior EKG in October 2014. Her chest x-ray was also unremarkable. Her cardiac enzymes were normal. However, patient was having chest pains which were prolonged, and hospitalist services were contacted for admission.   PAST MEDICAL  HISTORY: Significant for history of severe sepsis due to pneumonia in November 2014, history of AFib, atrial flutter with RVR during the same admission, COPD, not on oxygen therapy at home, acute-on-chronic renal failure during that admission in November 2014, hypothyroidism, questionable pneumothorax, coronary artery disease, diabetes mellitus. However, patient denies being diabetic. Osteoarthritis, irritable bowel syndrome, leukopenia as well as thrombocytopenia, anxiety, history of anemia, obstructive sleep apnea on CPAP at home, hyperlipidemia as well as hypertension.   PAST SURGICAL HISTORY: Bilateral knee replacement, hysterectomy, bladder tuck, ear surgery as well as right cataract surgery. Also cardiac catheterization done in August 2012. At that time, normal left ventricular function was noted as well as mild coronary artery disease in left circumflex as well as RCA, moderate ostial stenosis of LIMA to LAD was noted. However, patient's LIMA was a small artery and not amenable to PCI.   ALLERGIES: SULFA, FLAGYL, SHELLFISH, METFORMIN, ALEVE and SYNTHROID. However, tolerates Levoxyl.   SOCIAL HISTORY: Quit smoking in 1995, started smoking at the age of 79. The patient is a Jehovah's Witness, would not receive any blood products.   FAMILY HISTORY: Asthma in patient's sister. Father has history of CVA as well as cerebral hemorrhage in the family.  MEDICATIONS:  The patient's medication list apparently is not with her. However, she tells me her medications are the same as when she was discharged. Based on medical records, she was discharged on  1.  Diovan 160 mg p.o. daily. 2.  Metoprolol XL 25 mg p.o. daily.   3.  Spiriva 18 mcg p.o.  daily. 4.  Macrobid 100 mg p.o. at bedtime. 5.  Ventolin 2 puffs 4 times daily as needed. 6.   Pravachol 40 mg p.o. daily. 7.   Levothroid 100 mcg p.o. daily. 8.   Aspirin 81 mg p.o. daily. 9.   Vitamin D2, 50,000 international units daily.  10.  Norco 5/325, 1  tablet every 6 hours as needed. 11.  Maalox 30 mL every 6 hours as needed. 12.  Ranitidine 150 mg p.o. twice daily. 13.  DuoNeb 3 mL every 4 to 6 hours as needed. 14.  Bacitracin topical to affected area daily. 15.  Zofran 4 mg every 8 hours as needed. 16.  Zoloft 150 mg p.o. daily. 17.  Advair Diskus 250/50 twice daily. 18.  Magic mouthwash 1 to 2 times daily as needed. 19.  Claritin 10 mg p.o. daily as needed.  20.  Cheratussin 5 mL every 6 hours as needed. 21.   Dulcolax suppository as needed. 22.   Imodium 2 tablets as needed for diarrhea. 23.   Protonix 40 mg p.o. twice daily. 24.   Mobic 7.5 mg p.o. daily. 25.   Ventolin HFA 2 puffs in the morning with Spiriva. 26.   Lasix 10 mg p.o. daily. 27.   Accolate 20 mg p.o. twice daily. 28.   Tylenol 325 mg tablets 2 tablets every 6 hours as needed.  29.   Amitiza 8 mcg p.o. daily. 30.   Lorazepam 1 mg twice daily. 31.   Nitroglycerin sublingually as needed.  32.  Lasix 20 mg p.o. as needed for weight gain, more than 2 pounds in one day or greater than 5 pounds in one week.   REVIEW OF SYSTEMS:  CONSTITUTIONAL:  The patient admits to having colds intermittently. Admits to having weight gain, approximately from 177 to 199 pounds in the past 2 years.  Otherwise, denies any fevers, fatigue, weakness, weight loss or weight gain.  HEENT:  Runny nose, watery eyes.   Denies  blurry vision. No double vision, glaucoma or cataracts. Denies any tinnitus, allergies, epistaxis, sinus pain, dentures or difficulty swallowing.  RESPIRATORY:  Some coughing, which seemed to be dry, with shortness of breath. This seems to be chronic. Admits to having 2-pillow orthopnea as well as PND. Denies any wheezes, hemoptysis. Admits to shortness of breath. Poor exercise tolerance.   CARDIOVASCULAR: Admits to orthopnea. Denies any edema, arrhythmias, palpitations or syncope.  GASTROINTESTINAL:  Some intermittent abdominal pains, which she describes as constant, but  those are chronic. Admits to intermittent constipation as well as diarrhea over the past few days. Feels it is due to her IBS.  Denies nausea, vomiting.  No hematemesis, rectal bleeding, change in bowel habits.  GENITOURINARY: Denies dysuria, hematuria, frequency, incontinence.  ENDOCRINOLOGY: Denies polydipsia, nocturia, thyroid problems, heat or cold intolerance, or thirst.  HEMATOLOGIC: Denies anemia, easy bruising, bleeding or swollen glands.  SKIN: Denies acne, rashes, change in moles.  MUSCULOSKELETAL: Admits to arthritis, mostly in her hands.  Also admits to having some left hand finger spasms as well as muscle spasms intermittently on and off. Admits to cramps. Denies any swelling or gout.  NEUROLOGIC: No numbness, epilepsy or tremor.  PSYCHIATRIC: Denies anxiety, insomnia.   PHYSICAL EXAMINATION: VITAL SIGNS: On arrival to the hospital, temperature is 97.8, pulse was 70, respiratory rate was 24, blood pressure 136/84, saturation was 96% on room air.  GENERAL: This is a well-developed, well-nourished, mildly obese Caucasian female in no significant distress, lying on the stretcher here. HEENT: Pupils  are equal, react to light. Extraocular movements intact. No icterus or conjunctivitis. Has normal hearing. No pharyngeal edema. Mucosa is moist. The patient does have oxygen per nasal cannula in her nares bilaterally.  NECK: No other masses. Supple, nontender. Thyroid not enlarged. No adenopathy. No JVD or carotid bruits. Full range of motion.  LUNGS: Clear to auscultation all fields. Mildly diminished breath sounds but there was no wheezing. No labored inspirations, increased effort, dullness to percussion, overt respiratory distress.  CARDIOVASCULAR: S1, S2 appreciated. Rhythm is regular. Somewhat distant. PMI not lateralized. Chest is minimally tender to palpation. However, patient denies that this is the same kind of discomfort which she had when she first came in to the hospital. Peripheral  pulses are palpable. No lower extremity edema, calf tenderness or cyanosis was noted.  ABDOMEN: Soft, nontender. Bowel sounds are present. No splenomegaly or masses were noted.  RECTAL: Exam deferred.  MUSCLE STRENGTH: Able to move extremities. No cyanosis, degenerative joint disease, or kyphosis.  Gait is not tested.   SKIN: Did not reveal any rashes, lesions, erythema, nodularity or induration. It was warm and dry to palpation.  LYMPHATIC: No adenopathy in the cervical region.  NEUROLOGICAL: Cranial nerves grossly intact. Sensory is intact. No dysarthria or aphasia. The patient is alert and oriented to time, person, place. Cooperative. Memory is good. No significant confusion, agitation or depression noted.   DATA: EKG showed normal sinus rhythm with right bundle branch block at 73 beats per minute, normal axis, no acute ST-T changes were noted. Nonspecific ST-T changes, however, were noted in her septal as well as anterior leads. However, this did not seem to be changing significantly since October 2014 EKG.   Labs: BMP revealed BUN and creatinine of 24 and 1.56. Liver enzymes were normal. Cardiac enzymes, first set negative. CBC within normal limits, except the platelet count was low at 140. As compared to prior studies, patient's creatinine is about the same as it has been in the past.   The patient's  platelet count is actually improved since prior study in October/2014.   RADIOLOGIC STUDIES: Chest x-ray, portable, single view, 03/02/2013 revealed no active disease.   The patient's urinalysis was remarkable for 11 white blood cells, 7 red blood cells, 1+ leukocyte esterase, 1+ blood, trace bacteria, as well as 5 epithelial cells. Mucus was present as well as 24 hyaline casts, and cloudy appearance.   ASSESSMENT AND PLAN: 1. Chest pain: Questionable unstable angina. Admit patient to medical floor. Start her on metoprolol, nitroglycerin, aspirin, as well as Lovenox. Follow cardiac enzymes x 3.  Get cardiologist involved, and get echocardiogram repeated.   2. Hypertension: Will resume metoprolol, but will hold Diovan. Apparently patient has been recently taken off beta blockers as well as Diovan due to fluctuating blood pressure.   3.  Renal insufficiency:  Seemed to be chronic and stable.   4.  Thrombocytopenia:  Chronic and stable, will follow.   5.  Questionable urinary tract infection. Get urine cultures and start patient on Levaquin p.o.   6. The patient denies being diabetic. Will get hemoglobin A1c. Will continue her on sliding scale insulin as well as diabetic diet.   TIME SPENT: 50 minutes.    ____________________________ Katharina Caper, MD rv:mr D: 03/02/2013 18:06:00 ET T: 03/02/2013 19:19:44 ET JOB#: [id number]  cc: [facility] Katharina Caper, MD, <Dictator>    Jeannett Dekoning MD ELECTRONICALLY SIGNED 03/06/2013 16:53

## 2014-06-14 NOTE — Discharge Summary (Signed)
PATIENT NAME:  Melanie Wang, Melanie Wang MR#:  [id number] DATE OF BIRTH:  [birth year]  DATE OF ADMISSION:  03/02/2013 DATE OF DISCHARGE:  03/04/2013  ADMISSION DIAGNOSIS: 1.  Chest pain.   DISCHARGE DIAGNOSES: 1.  Chest pain, likely not cardiac in nature.  2.  Hypertension.  3.  Hypothyroidism.  4.  Low magnesium, repleted.  5.  Urinary tract infection.   CONSULTATIONS: Dr. Darrold Junker.   IMAGING: The patient underwent a Myoview stress test which essentially showed no evidence of ischemia. Her EF was 71%.   Troponins x 3 were all negative.   LDL 60, VLDL 29, HDL 47, triglycerides 146, cholesterol 136.   HOSPITAL COURSE: A 79 year old female who presented with atypical chest pain. For further details, please refer to the H and P.  1.  Chest pain: The patient was admitted to telemetry to rule out unstable angina. Her troponins were negative. EKG showed no acute changes. Dr. Darrold Junker was consulted given her history of CAD and CABG. He recommended a Myoview stress test. If this was normal, he did not feel that this was related to her heart. The patient did undergo a cardiac stress test and essentially this was negative.  She was continued on her current medical management for CAD with aspirin, beta blocker and statin.  2.  Hypertension: The patient's blood pressure was controlled in the hospital.  3.  Hypothyroidism:  The patient will continue on her thyroid replacement.  4.  Low magnesium: The patient's magnesium was repleted.  5.  Urinary tract infection: A urine culture shows contamination. She was on Levaquin, which she will continue.  6.  Mood disorder: The patient was continued on her outpatient medications including Zoloft.   DISCHARGE MEDICATIONS: 1.  Levothyroxine 100 mcg daily.  2.  Aspirin 81 mg daily.  3.  Vitamin D2, 50,000 International Units on Mondays.  4.  Norco 5/325 q.6 hours p.r.n. for severe back pain.  5.  Maalox 30 mL q.6 hours p.r.n.  6.  Ranitidine 150 mg b.i.d.  7.   DuoNeb q.4-6 hours p.r.n.  8.  Zoloft 100 mg 1-1/2 tablets daily.  9.  Advair 250/50 b.i.d.  10.  Protonix 40 mg b.i.d.  11.  Mobic 7.5 mg daily.  12.  Ventolin HFA 2 puffs in the morning.  13.  Lasix 20 mg daily.  14.  Accolate 20 mg b.i.d.  15.  Tylenol 650 mg q.6 hours p.r.n. pain.  16.  Amitiza 8 mcg daily.  17  Clonazepam 1 mg b.i.d.  18.  Nitroglycerin sublingual p.r.n. chest pain.  19.  Lidoderm 5% to affected area on for 12 hours then off.  20.  Valsartan 160 mg daily is on hold. 21.  KCl 10 mEq daily.  22.  Prevacid 40 mg daily.  23.  Spiriva 18 mcg daily.  24.  Metoprolol 25 mg daily.  25.  Lasix 20 mg as needed for weight gain.  26.  Levaquin 250 mg q.24 hours x 5 days.  27.  Macrobid 100 mg daily. This is to be started after Levaquin has been completed.   Discharge home health with physical therapy and nurse.   DISCHARGE DIET: Low sodium.   DISCHARGE ACTIVITY: As tolerated.   DISCHARGE FOLLOWUP: The patient will follow up with Dr. Shon Baton in 1 to 2 weeks.   The patient is medically stable for discharge.   TIME SPENT: Approximately 40 minutes on discharge.   ____________________________ Vladislav Axelson P. Juliene Pina, MD spm:dmm D: 03/04/2013 14:06:49 ET T: 03/04/2013  21:33:21 ET JOB#: [id number]  cc: Mitra Duling P. Juliene PinaMody, MD, <Dictator> Durel SaltsKeatah Brooks, FNP Janyth ContesSITAL P Jamilya Sarrazin MD ELECTRONICALLY SIGNED 03/05/2013 13:34

## 2014-06-14 NOTE — Consult Note (Signed)
PATIENT NAME:  Melanie Wang, Melanie Wang MR#:  [id number] DATE OF BIRTH:  1935-08-04  DATE OF CONSULTATION:  03/03/2013  CONSULTING PHYSICIAN:  Marcina Millard, MD  PRIMARY CARDIOLOGIST: Dr. Gwen Pounds.   CHIEF COMPLAINT: Chest pain.   REASON FOR CONSULTATION: Consultation requested for evaluation of chest pain.   HISTORY OF PRESENT ILLNESS: The patient is a 79 year old female with known coronary artery disease, status post bypass graft surgery. She is admitted after an episode of chest pain, unrelieved with sublingual nitroglycerin. The patient is status post single vessel coronary artery bypass graft surgery with LIMA to LAD in 1995. She underwent cardiac catheterization 10/04/2010 which revealed occluded mid left anterior descending coronary artery, 30% stenosis left circumflex coronary artery, 50% stenosis distal right coronary artery and 75% stenosis in the ostium of the lesion of the LAD, which was felt to be best treated medically. The patient has done reasonably well, is active, works in the yard, has had no recurrent chest pain. Yesterday, the patient was untangling electrical extension cords, got frustrated, overworked herself and developed mid sternal chest discomfort. The patient took a sublingual nitroglycerin without relief. She presented to [facility] Emergency Room where EKG was nondiagnostic. Initial troponins are negative. The patient currently is chest pain free.   PAST MEDICAL HISTORY: 1. Single vessel coronary artery bypass graft surgery in 1995.  2. Hypertension.  3. Chronic kidney disease.   MEDICATIONS: Valsartan 160 mg daily, pravastatin 40 mg daily, potassium chloride 10 mEq daily, metoprolol succinate 1 tab daily, furosemide 20 mg daily, aspirin 81 mg daily, Zofran 4 mg daily. Vitamin D 50,000 international units monthly. Ventolin inhaler 2 puffs daily, and Tylenol 2 tabs q.6h. p.r.n. Spiriva 18 mcg inhalation capsule daily, sertraline 150 mg daily, ranitidine 150 mg b.i.d., Protonix 40  mg b.i.d. Norco 1 tab q.6 p.r.n. Mobic 7.5 mg daily, Magic Mouthwash one dose b.i.d., Macrobid 100 mg daily, Maalox 33 mL q.6 p.r.n.,   100 mcg daily, Imodium 2 mg daily, DuoNeb 3 mL, Dulcolax 10 mg suppository daily, clonazepam 1 mg b.i.d., Claritin 10 mg daily, Amitiza 8 mg daily, Advair 2 puffs q.12h. Accolate  20 mg b.i.d.   SOCIAL HISTORY: The patient quit tobacco abuse in 1995.   FAMILY HISTORY: No immediate family history of coronary artery disease or myocardial infarction.   REVIEW OF SYSTEMS:  CONSTITUTIONAL: No fever or chills.  EYES: No blurry vision.  EARS: No hearing loss.  RESPIRATORY: No shortness of breath.  CARDIOVASCULAR: Chest pain as described above.  GASTROINTESTINAL: The patient has known history of reflux disease.  GENITOURINARY: No dysuria or hematuria.  ENDOCRINE: No polyuria or polydipsia.  MUSCULOSKELETAL: No arthralgias or myalgias.  NEUROLOGICAL: No focal muscle weakness or numbness.  PSYCHOLOGICAL: No depression or anxiety.   PHYSICAL EXAMINATION: VITAL SIGNS: Blood pressure 101/57, pulse 57, respirations 18, temperature 97.8, pulse oximetry 99%.  HEENT: Pupils equal and reactive to light and accommodation.  NECK: Supple without thyromegaly.  LUNGS: Clear.  HEART: Normal JVP. Normal PMI. Regular rate and rhythm. Normal S1, S2. No appreciable gallop, murmur, or rub.  ABDOMEN: Soft and nontender. Pulses were intact bilaterally.  MUSCULOSKELETAL: Normal muscle tone.  NEUROLOGIC: The patient is alert and oriented x 3. Motor and sensory both grossly intact.   IMPRESSION: A 79 year old female with known coronary artery disease, status post single-vessel coronary artery bypass graft surgery with known 75% stenosis in the ostium of the LIMA to LAD best felt to be treated medically with episode of chest pain with negative troponin, currently  chest pain-free.   RECOMMENDATIONS: 1. Agree with overall current therapy.  2. Proceed with Lexiscan sestamibi study in the  a.m.  3. Further recommendations including cardiac catheterization pending Lexiscan sestamibi results.    ____________________________ Marcina MillardAlexander Jenavive Lamboy, MD ap:sg D: 03/03/2013 09:47:41 ET T: 03/03/2013 10:18:23 ET JOB#: [id number]  cc: Marcina MillardAlexander Vincentina Sollers, MD, <Dictator> Marcina MillardALEXANDER Colonel Krauser MD ELECTRONICALLY SIGNED 04/09/2013 12:26

## 2014-07-07 ENCOUNTER — Inpatient Hospital Stay
Admission: AD | Admit: 2014-07-07 | Discharge: 2014-07-15 | DRG: 372 | Disposition: A | Discharge status: Skilled Nursing Facility | Payer: Medicare (Managed Care) | Source: Ambulatory Visit | Attending: Internal Medicine | Admitting: Internal Medicine

## 2014-07-07 DIAGNOSIS — Z882 Allergy status to sulfonamides status: Secondary | ICD-10-CM

## 2014-07-07 DIAGNOSIS — E876 Hypokalemia: Secondary | ICD-10-CM | POA: Diagnosis present

## 2014-07-07 DIAGNOSIS — R1310 Dysphagia, unspecified: Secondary | ICD-10-CM | POA: Diagnosis present

## 2014-07-07 DIAGNOSIS — E785 Hyperlipidemia, unspecified: Secondary | ICD-10-CM | POA: Diagnosis present

## 2014-07-07 DIAGNOSIS — Z7951 Long term (current) use of inhaled steroids: Secondary | ICD-10-CM

## 2014-07-07 DIAGNOSIS — Z87891 Personal history of nicotine dependence: Secondary | ICD-10-CM | POA: Diagnosis not present

## 2014-07-07 DIAGNOSIS — A020 Salmonella enteritis: Secondary | ICD-10-CM | POA: Diagnosis present

## 2014-07-07 DIAGNOSIS — Z79899 Other long term (current) drug therapy: Secondary | ICD-10-CM

## 2014-07-07 DIAGNOSIS — I2510 Atherosclerotic heart disease of native coronary artery without angina pectoris: Secondary | ICD-10-CM | POA: Diagnosis present

## 2014-07-07 DIAGNOSIS — N189 Chronic kidney disease, unspecified: Secondary | ICD-10-CM | POA: Diagnosis present

## 2014-07-07 DIAGNOSIS — D649 Anemia, unspecified: Secondary | ICD-10-CM | POA: Diagnosis present

## 2014-07-07 DIAGNOSIS — K219 Gastro-esophageal reflux disease without esophagitis: Secondary | ICD-10-CM | POA: Diagnosis present

## 2014-07-07 DIAGNOSIS — Z888 Allergy status to other drugs, medicaments and biological substances status: Secondary | ICD-10-CM | POA: Diagnosis not present

## 2014-07-07 DIAGNOSIS — R112 Nausea with vomiting, unspecified: Secondary | ICD-10-CM | POA: Diagnosis present

## 2014-07-07 DIAGNOSIS — D696 Thrombocytopenia, unspecified: Secondary | ICD-10-CM | POA: Diagnosis present

## 2014-07-07 DIAGNOSIS — Z7952 Long term (current) use of systemic steroids: Secondary | ICD-10-CM | POA: Diagnosis not present

## 2014-07-07 DIAGNOSIS — E860 Dehydration: Secondary | ICD-10-CM | POA: Diagnosis present

## 2014-07-07 DIAGNOSIS — Z66 Do not resuscitate: Secondary | ICD-10-CM | POA: Diagnosis present

## 2014-07-07 DIAGNOSIS — G4733 Obstructive sleep apnea (adult) (pediatric): Secondary | ICD-10-CM | POA: Diagnosis present

## 2014-07-07 DIAGNOSIS — K5790 Diverticulosis of intestine, part unspecified, without perforation or abscess without bleeding: Secondary | ICD-10-CM | POA: Diagnosis present

## 2014-07-07 DIAGNOSIS — M1990 Unspecified osteoarthritis, unspecified site: Secondary | ICD-10-CM | POA: Diagnosis present

## 2014-07-07 DIAGNOSIS — J449 Chronic obstructive pulmonary disease, unspecified: Secondary | ICD-10-CM | POA: Diagnosis present

## 2014-07-07 DIAGNOSIS — I4891 Unspecified atrial fibrillation: Secondary | ICD-10-CM | POA: Diagnosis present

## 2014-07-07 DIAGNOSIS — F419 Anxiety disorder, unspecified: Secondary | ICD-10-CM | POA: Diagnosis present

## 2014-07-07 DIAGNOSIS — I129 Hypertensive chronic kidney disease with stage 1 through stage 4 chronic kidney disease, or unspecified chronic kidney disease: Secondary | ICD-10-CM | POA: Diagnosis present

## 2014-07-07 DIAGNOSIS — E119 Type 2 diabetes mellitus without complications: Secondary | ICD-10-CM | POA: Diagnosis present

## 2014-07-07 DIAGNOSIS — Z79891 Long term (current) use of opiate analgesic: Secondary | ICD-10-CM

## 2014-07-07 DIAGNOSIS — R197 Diarrhea, unspecified: Secondary | ICD-10-CM

## 2014-07-07 DIAGNOSIS — K580 Irritable bowel syndrome with diarrhea: Secondary | ICD-10-CM | POA: Diagnosis present

## 2014-07-07 DIAGNOSIS — E875 Hyperkalemia: Secondary | ICD-10-CM | POA: Diagnosis present

## 2014-07-07 DIAGNOSIS — K222 Esophageal obstruction: Secondary | ICD-10-CM | POA: Diagnosis present

## 2014-07-07 DIAGNOSIS — N179 Acute kidney failure, unspecified: Secondary | ICD-10-CM | POA: Diagnosis present

## 2014-07-07 HISTORY — DX: Allergy, unspecified, initial encounter: T78.40XA

## 2014-07-07 HISTORY — DX: Type 2 diabetes mellitus without complications: E11.9

## 2014-07-07 HISTORY — DX: Anemia, unspecified: D64.9

## 2014-07-07 HISTORY — DX: Unspecified osteoarthritis, unspecified site: M19.90

## 2014-07-07 HISTORY — DX: Essential (primary) hypertension: I10

## 2014-07-07 HISTORY — DX: Chronic obstructive pulmonary disease, unspecified: J44.9

## 2014-07-07 HISTORY — DX: Gastro-esophageal reflux disease without esophagitis: K21.9

## 2014-07-07 LAB — COMPREHENSIVE METABOLIC PANEL
ALBUMIN: 3.9 g/dL (ref 3.5–5.0)
ALT: 17 U/L (ref 14–54)
ANION GAP: 12 (ref 5–15)
AST: 36 U/L (ref 15–41)
Alkaline Phosphatase: 50 U/L (ref 38–126)
BUN: 37 mg/dL — ABNORMAL HIGH (ref 6–20)
CALCIUM: 8.7 mg/dL — AB (ref 8.9–10.3)
CO2: 25 mmol/L (ref 22–32)
Chloride: 102 mmol/L (ref 101–111)
Creatinine, Ser: 2 mg/dL — ABNORMAL HIGH (ref 0.44–1.00)
GFR calc Af Amer: 27 mL/min — ABNORMAL LOW (ref >60)
GFR, EST NON AFRICAN AMERICAN: 23 mL/min — AB (ref >60)
Glucose, Bld: 119 mg/dL — ABNORMAL HIGH (ref 65–99)
Potassium: 3.3 mmol/L — ABNORMAL LOW (ref 3.5–5.1)
Sodium: 139 mmol/L (ref 135–145)
Total Bilirubin: 0.8 mg/dL (ref 0.3–1.2)
Total Protein: 6.9 g/dL (ref 6.5–8.1)

## 2014-07-07 LAB — CBC
HCT: 41.1 % (ref 35.0–47.0)
HCT: 40 % (ref 35.0–47.0)
Hemoglobin: 13.6 g/dL (ref 12.0–16.0)
Hemoglobin: 13.3 g/dL (ref 12.0–16.0)
MCH: 30.1 pg (ref 26.0–34.0)
MCH: 30.3 pg (ref 26.0–34.0)
MCHC: 33.1 g/dL (ref 32.0–36.0)
MCHC: 33.3 g/dL (ref 32.0–36.0)
MCV: 91 fL (ref 80.0–100.0)
MCV: 90.9 fL (ref 80.0–100.0)
PLATELETS: 95 10*3/uL — AB (ref 150–440)
Platelets: 94 10*3/uL — ABNORMAL LOW (ref 150–440)
RBC: 4.52 MIL/uL (ref 3.80–5.20)
RBC: 4.4 MIL/uL (ref 3.80–5.20)
RDW: 14.4 % (ref 11.5–14.5)
RDW: 14 % (ref 11.5–14.5)
WBC: 4.6 10*3/uL (ref 3.6–11.0)
WBC: 4.4 10*3/uL (ref 3.6–11.0)

## 2014-07-07 LAB — CREATININE, SERUM
Creatinine, Ser: 1.95 mg/dL — ABNORMAL HIGH (ref 0.44–1.00)
GFR calc non Af Amer: 24 mL/min — ABNORMAL LOW (ref >60)
GFR, EST AFRICAN AMERICAN: 28 mL/min — AB (ref >60)

## 2014-07-07 LAB — TSH: TSH: 0.462 u[IU]/mL (ref 0.350–4.500)

## 2014-07-07 LAB — GLUCOSE, CAPILLARY: Glucose-Capillary: 139 mg/dL — ABNORMAL HIGH (ref 65–99)

## 2014-07-07 LAB — C DIFFICILE QUICK SCREEN W PCR REFLEX
C DIFFICILE (CDIFF) INTERP: NEGATIVE
C Diff antigen: NEGATIVE
C Diff toxin: NEGATIVE

## 2014-07-07 LAB — LIPASE, BLOOD: Lipase: 19 U/L — ABNORMAL LOW (ref 22–51)

## 2014-07-07 MED ORDER — FLUTICASONE PROPIONATE 50 MCG/ACT NA SUSP
1.0000 | Freq: Every day | NASAL | Status: DC | PRN
  Filled 2014-07-07: qty 16

## 2014-07-07 MED ORDER — SODIUM CHLORIDE 0.9 % IV SOLN
INTRAVENOUS | Status: DC
  Administered 2014-07-07: 20:00:00 via INTRAVENOUS

## 2014-07-07 MED ORDER — TIOTROPIUM BROMIDE MONOHYDRATE 18 MCG IN CAPS
18.0000 ug | ORAL_CAPSULE | Freq: Every day | RESPIRATORY_TRACT | Status: DC
  Administered 2014-07-08 – 2014-07-15 (×7): 18 ug via RESPIRATORY_TRACT
  Filled 2014-07-07 (×2): qty 5

## 2014-07-07 MED ORDER — PROMETHAZINE HCL 25 MG/ML IJ SOLN
12.5000 mg | Freq: Four times a day (QID) | INTRAMUSCULAR | Status: DC | PRN
Start: 2014-07-07 — End: 2014-07-15
  Administered 2014-07-07: 25 mg via INTRAVENOUS
  Administered 2014-07-09: 12.5 mg via INTRAVENOUS
  Filled 2014-07-07: qty 1

## 2014-07-07 MED ORDER — TIOTROPIUM BROMIDE MONOHYDRATE 2.5 MCG/ACT IN AERS: 2.0000 | INHALATION_SPRAY | Freq: Every day | RESPIRATORY_TRACT | Status: DC

## 2014-07-07 MED ORDER — INSULIN ASPART 100 UNIT/ML ~~LOC~~ SOLN
0.0000 [IU] | Freq: Three times a day (TID) | SUBCUTANEOUS | Status: DC
  Administered 2014-07-08 – 2014-07-13 (×2): 2 [IU] via SUBCUTANEOUS
  Administered 2014-07-14 (×2): 1 [IU] via SUBCUTANEOUS
  Filled 2014-07-07: qty 1
  Filled 2014-07-07: qty 2
  Filled 2014-07-07 (×2): qty 1
  Filled 2014-07-07: qty 2

## 2014-07-07 MED ORDER — PROMETHAZINE HCL 25 MG/ML IJ SOLN
INTRAMUSCULAR | Status: AC
  Filled 2014-07-07: qty 1

## 2014-07-07 MED ORDER — ONDANSETRON HCL 4 MG/2ML IJ SOLN
4.0000 mg | Freq: Four times a day (QID) | INTRAMUSCULAR | Status: DC | PRN
  Administered 2014-07-07 – 2014-07-12 (×6): 4 mg via INTRAVENOUS
  Filled 2014-07-07 (×6): qty 2

## 2014-07-07 MED ORDER — SERTRALINE HCL 50 MG PO TABS
150.0000 mg | ORAL_TABLET | Freq: Every day | ORAL | Status: DC
  Administered 2014-07-08 – 2014-07-15 (×8): 150 mg via ORAL
  Filled 2014-07-07 (×8): qty 3

## 2014-07-07 MED ORDER — CLONAZEPAM 1 MG PO TABS
1.0000 mg | ORAL_TABLET | Freq: Two times a day (BID) | ORAL | Status: DC
  Administered 2014-07-08 – 2014-07-15 (×14): 1 mg via ORAL
  Filled 2014-07-07 (×14): qty 1

## 2014-07-07 MED ORDER — ONDANSETRON HCL 4 MG PO TABS: 4.0000 mg | ORAL_TABLET | Freq: Four times a day (QID) | ORAL | Status: DC | PRN

## 2014-07-07 MED ORDER — MOMETASONE FURO-FORMOTEROL FUM 200-5 MCG/ACT IN AERO
2.0000 | INHALATION_SPRAY | Freq: Two times a day (BID) | RESPIRATORY_TRACT | Status: DC
  Administered 2014-07-08 – 2014-07-15 (×15): 2 via RESPIRATORY_TRACT
  Filled 2014-07-07: qty 8.8

## 2014-07-07 MED ORDER — ALBUTEROL SULFATE (2.5 MG/3ML) 0.083% IN NEBU: 3.0000 mL | INHALATION_SOLUTION | Freq: Four times a day (QID) | RESPIRATORY_TRACT | Status: DC | PRN

## 2014-07-07 MED ORDER — VANCOMYCIN 50 MG/ML ORAL SOLUTION
250.0000 mg | Freq: Four times a day (QID) | ORAL | Status: DC
  Administered 2014-07-08 (×2): 250 mg via ORAL
  Filled 2014-07-07 (×7): qty 5

## 2014-07-07 MED ORDER — PANTOPRAZOLE SODIUM 40 MG PO TBEC
40.0000 mg | DELAYED_RELEASE_TABLET | Freq: Two times a day (BID) | ORAL | Status: DC
  Administered 2014-07-08 – 2014-07-15 (×14): 40 mg via ORAL
  Filled 2014-07-07 (×14): qty 1

## 2014-07-07 MED ORDER — HEPARIN SODIUM (PORCINE) 5000 UNIT/ML IJ SOLN
5000.0000 [IU] | Freq: Three times a day (TID) | INTRAMUSCULAR | Status: DC
  Administered 2014-07-08: 5000 [IU] via SUBCUTANEOUS
  Filled 2014-07-07: qty 1

## 2014-07-07 MED ORDER — ASPIRIN EC 81 MG PO TBEC
81.0000 mg | DELAYED_RELEASE_TABLET | Freq: Every day | ORAL | Status: DC
  Administered 2014-07-08 – 2014-07-15 (×8): 81 mg via ORAL
  Filled 2014-07-07 (×8): qty 1

## 2014-07-07 MED ORDER — LEVOTHYROXINE SODIUM 100 MCG PO TABS
100.0000 ug | ORAL_TABLET | Freq: Every day | ORAL | Status: DC
  Administered 2014-07-08 – 2014-07-15 (×7): 100 ug via ORAL
  Filled 2014-07-07 (×8): qty 1

## 2014-07-07 MED ORDER — PRAVASTATIN SODIUM 20 MG PO TABS
40.0000 mg | ORAL_TABLET | Freq: Every day | ORAL | Status: DC
  Administered 2014-07-08 – 2014-07-15 (×8): 40 mg via ORAL
  Filled 2014-07-07 (×8): qty 2

## 2014-07-07 NOTE — Plan of Care (Signed)
Problem: Discharge Progression Outcomes Goal: Discharge plan in place and appropriate Outcome: Progressing  Progressing towards outcome: Pt admitted for possible C-diff ( stool sent) Complains of N/V/D since Friday.  Goal: Pain controlled with appropriate interventions Outcome: Progressing No pain noted  Goal: Tolerating diet Outcome: Progressing  Complains of Nausea, given zofran PRN  Clear liquid to full liquid, advancing as tolerated

## 2014-07-07 NOTE — Progress Notes (Signed)
Pt was D-diff negative. Enteric precautions discontinued.

## 2014-07-07 NOTE — Plan of Care (Signed)
Problem: Discharge Progression Outcomes Goal: Discharge plan in place and appropriate Outcome: Progressing C-diff was negative, pt had several episodes of loose stool and N/V, relieved by Zofran. Goal: Pain controlled with appropriate interventions Outcome: Progressing No pain noted, pt denies any pain of discomfort at this time

## 2014-07-07 NOTE — H&P (Addendum)
[facility] [place] [facility]   PATIENT NAME: Melanie Wang    MR#:  [id number]  DATE OF BIRTH:  07/07/1935  DATE OF ADMISSION:  07/07/2014  PRIMARY CARE PHYSICIAN: [facility] [place]-Dr. Hinda LenisSharon Reilly  REQUESTING/REFERRING PHYSICIAN: Dr. Hinda LenisSharon Reilly  CHIEF COMPLAINT:  Diarrhea  HISTORY OF PRESENT ILLNESS: Melanie Wang  is a 79 y.o. female with a known history of AFib, atrial flutter with RVR, COPD, coronary artery disease, chronic renal failure, coronary artery disease, diabetes mellitus, Osteoarthritis, irritable bowel syndrome, leukopenia, thrombocytopenia, anxiety, anemia, obstructive sleep apnea on CPAP at home, hyperlipidemia , hypertension. For last 3 days had diarrhea and vomiting- not able to eat any since Friday.  Felt very weak- [facility]- they sent c diff, gave some IV fluid and oral vancomycin- and sent her for direct admission. She denies abd pain, fever, chills.  PAST MEDICAL HISTORY:   Past Medical History  Diagnosis Date  . Allergy   . Anemia   . Arthritis   . COPD (chronic obstructive pulmonary disease)   . Diabetes mellitus without complication   . GERD (gastroesophageal reflux disease)   . Hypertension     PAST SURGICAL HISTORY: History reviewed. No pertinent past surgical history.  SOCIAL HISTORY:  History  Substance Use Topics  . Smoking status: Former Games developermoker  . Smokeless tobacco: Never Used  . Alcohol Use: No    FAMILY HISTORY:  Family History  Problem Relation Age of Onset  . Asthma Sister     DRUG ALLERGIES:  Allergies  Allergen Reactions  . Flagyl [Metronidazole] Hives  . Gabapentin Other (See Comments)    Reaction:  Unknown  . Glucophage [Metformin Hcl] Diarrhea  . Plavix [Clopidogrel] Other (See Comments)    Reaction:  Unknown  . Shellfish Allergy Other (See Comments)    Reaction:  Unknown  . Synthroid [Levothyroxine] Other (See Comments)    Reaction:  Unknown   . Naprosyn [Naproxen] Itching and Rash  . Sulfa Antibiotics Rash    REVIEW OF SYSTEMS:   CONSTITUTIONAL: No fever, positive for fatigue or weakness.  EYES: No blurred or double vision.  EARS, NOSE, AND THROAT: No tinnitus or ear pain.  RESPIRATORY: No cough, shortness of breath, wheezing or hemoptysis.  CARDIOVASCULAR: No chest pain, orthopnea, edema.  GASTROINTESTINAL: excessive nausea, vomiting & diarrhea . No abdominal pain.  GENITOURINARY: No dysuria, hematuria.  ENDOCRINE: No polyuria, nocturia,  HEMATOLOGY: No anemia, easy bruising or bleeding SKIN: No rash or lesion. MUSCULOSKELETAL: No joint pain or arthritis.   NEUROLOGIC: No tingling, numbness, weakness.  PSYCHIATRY: No anxiety or depression.   MEDICATIONS AT HOME:     Prior to Admission medications   Medication Sig Start Date End Date Taking? Authorizing Provider  acetaminophen (TYLENOL) 650 MG CR tablet Take 650 mg by mouth every 12 (twelve) hours as needed for pain.   Yes Historical Provider, MD  albuterol (PROVENTIL HFA;VENTOLIN HFA) 108 (90 BASE) MCG/ACT inhaler Inhale 2 puffs into the lungs 4 (four) times daily as needed for shortness of breath.   Yes Historical Provider, MD  albuterol (PROVENTIL) (2.5 MG/3ML) 0.083% nebulizer solution Take 2.5 mg by nebulization every 4 (four) hours as needed for wheezing or shortness of breath (for cough).   Yes Historical Provider, MD  aluminum-magnesium hydroxide-simethicone (MAALOX) 200-200-20 MG/5ML SUSP Take 30 mLs by mouth every 6 (six) hours as needed (for heartburn).   Yes Historical Provider, MD  aspirin EC 81 MG tablet  Take 81 mg by mouth daily.   Yes Historical Provider, MD  clonazePAM (KLONOPIN) 1 MG tablet Take 1 mg by mouth 2 (two) times daily.   Yes Historical Provider, MD  dicyclomine (BENTYL) 10 MG capsule Take 10 mg by mouth 2 (two) times daily as needed for spasms.   Yes Historical Provider, MD  ergocalciferol (VITAMIN D2) 50000 UNITS capsule Take 50,000 Units by  mouth every 30 (thirty) days. Pt takes on the first Monday of each month.   Yes Historical Provider, MD  fluticasone (FLONASE) 50 MCG/ACT nasal spray Place 1 spray into both nostrils daily as needed for allergies.   Yes Historical Provider, MD  fluticasone-salmeterol (ADVAIR HFA) 230-21 MCG/ACT inhaler Inhale 2 puffs into the lungs every 12 (twelve) hours.   Yes Historical Provider, MD  furosemide (LASIX) 20 MG tablet Take 20 mg by mouth daily as needed (for weight gain greater than 2lbs in one day or 5lbs in one week).   Yes Historical Provider, MD  guaiFENesin-dextromethorphan (ROBITUSSIN DM) 100-10 MG/5ML syrup Take 10 mLs by mouth every 4 (four) hours as needed for cough.   Yes Historical Provider, MD  HYDROcodone-acetaminophen (NORCO/VICODIN) 5-325 MG per tablet Take 1 tablet by mouth 3 (three) times daily as needed for moderate pain.   Yes Historical Provider, MD  hydrocortisone (ANUSOL-HC) 25 MG suppository Place 25 mg rectally at bedtime as needed for hemorrhoids.   Yes Historical Provider, MD  ipratropium-albuterol (DUONEB) 0.5-2.5 (3) MG/3ML SOLN Take 3 mLs by nebulization every 4 (four) hours as needed (for shortness of breath).   Yes Historical Provider, MD  levothyroxine (SYNTHROID, LEVOTHROID) 100 MCG tablet Take 100 mcg by mouth daily.   Yes Historical Provider, MD  lidocaine (LIDODERM) 5 % Place 1 patch onto the skin daily. Remove & Discard patch within 12 hours or as directed by MD   Yes Historical Provider, MD  loratadine (CLARITIN) 10 MG tablet Take 10 mg by mouth daily as needed for allergies.   Yes Historical Provider, MD  lubiprostone (AMITIZA) 8 MCG capsule Take 8 mcg by mouth every morning.   Yes Historical Provider, MD  meloxicam (MOBIC) 7.5 MG tablet Take 7.5 mg by mouth daily.   Yes Historical Provider, MD  nitroGLYCERIN (NITROSTAT) 0.4 MG SL tablet Place 0.4 mg under the tongue every 5 (five) minutes as needed for chest pain.   Yes Historical Provider, MD  ondansetron  (ZOFRAN) 4 MG tablet Take 4 mg by mouth every 8 (eight) hours as needed for nausea or vomiting.   Yes Historical Provider, MD  ondansetron (ZOFRAN-ODT) 4 MG disintegrating tablet Take 4 mg by mouth every 8 (eight) hours as needed for nausea or vomiting.   Yes Historical Provider, MD  pantoprazole (PROTONIX) 40 MG tablet Take 40 mg by mouth 2 (two) times daily.   Yes Historical Provider, MD  phenazopyridine (PYRIDIUM) 100 MG tablet Take 100 mg by mouth 3 (three) times daily as needed for pain.   Yes Historical Provider, MD  polyethylene glycol (MIRALAX / GLYCOLAX) packet Take 17 g by mouth daily.   Yes Historical Provider, MD  potassium chloride (K-DUR) 10 MEQ tablet Take 10 mEq by mouth daily.   Yes Historical Provider, MD  pravastatin (PRAVACHOL) 40 MG tablet Take 40 mg by mouth daily.   Yes Historical Provider, MD  predniSONE (DELTASONE) 20 MG tablet Take 20 mg by mouth 3 (three) times daily as needed (for breathing problems).   Yes Historical Provider, MD  ranitidine (ZANTAC) 150 MG tablet  Take 150 mg by mouth daily.   Yes Historical Provider, MD  sertraline (ZOLOFT) 100 MG tablet Take 150 mg by mouth daily.   Yes Historical Provider, MD  Tiotropium Bromide Monohydrate 2.5 MCG/ACT AERS Inhale 2 puffs into the lungs daily.   Yes Historical Provider, MD  zafirlukast (ACCOLATE) 20 MG tablet Take 20 mg by mouth 2 (two) times daily.   Yes Historical Provider, MD     PHYSICAL EXAMINATION:   VITAL SIGNS: Blood pressure 131/64, pulse 104, temperature 98.2 F (36.8 C), temperature source Oral, SpO2 98 %.  GENERAL:  79 y.o.-year-old patient lying in the bed with no acute distress.  EYES: Pupils equal, round, reactive to light and accommodation. No scleral icterus. Extraocular muscles intact.  HEENT: Head atraumatic, normocephalic. Oropharynx and nasopharynx clear. Dry mucosa. NECK:  Supple, no jugular venous distention. No thyroid enlargement, no tenderness.  LUNGS: Normal breath sounds bilaterally,  no wheezing, rales,rhonchi or crepitation. No use of accessory muscles of respiration.  CARDIOVASCULAR: S1, S2 normal. No murmurs, rubs, or gallops.  ABDOMEN: Soft, mild tender generalized , nondistended. Bowel sounds hyperactive. No organomegaly or mass.  EXTREMITIES: No pedal edema, cyanosis, or clubbing.  NEUROLOGIC: Cranial nerves II through XII are intact. Muscle strength 5/5 in all extremities. Sensation intact. Gait not checked.  PSYCHIATRIC: The patient is alert and oriented x 3.  SKIN: No obvious rash, lesion, or ulcer.   LABORATORY PANEL:   CBC  Recent Labs Lab 07/07/14 1822 07/07/14 1919  WBC 4.6 4.4  HGB 13.6 13.3  HCT 41.1 40.0  PLT 95* 94*  MCV 91.0 90.9  MCH 30.1 30.3  MCHC 33.1 33.3  RDW 14.4 14.0   ------------------------------------------------------------------------------------------------------------------  Chemistries   Recent Labs Lab 07/07/14 1822 07/07/14 1919  NA 139  --   K 3.3*  --   CL 102  --   CO2 25  --   GLUCOSE 119*  --   BUN 37*  --   CREATININE 2.00* 1.95*  CALCIUM 8.7*  --   AST 36  --   ALT 17  --   ALKPHOS 50  --   BILITOT 0.8  --    ------------------------------------------------------------------------------------------------------------------ CrCl cannot be calculated (Unknown ideal weight.). ------------------------------------------------------------------------------------------------------------------ No results for input(s): TSH, T4TOTAL, T3FREE, THYROIDAB in the last 72 hours.  Invalid input(s): FREET3  Urinalysis No results found for: COLORURINE, APPEARANCEUR, LABSPEC, PHURINE, GLUCOSEU, HGBUR, BILIRUBINUR, KETONESUR, PROTEINUR, UROBILINOGEN, NITRITE, LEUKOCYTESUR  IMPRESSION AND PLAN: * Diarrhea  Oral vanc for now- Check Stool for c diff.  Keep on enteric precaution for precaution.  * Dehydration   IV NS, Liquid diet.   Monitor and follow renal func.  * DM   Will just keep on ISS as will be not  eating much  * Htn  Hold Htn meds- due to diarrhea- may resume, if BP is high  * Nausea and vomit.   IV zofran.  * CAD *A fib  All the records are reviewed and case discussed with ED provider. Management plans discussed with the patient, family and they are in agreement.  CODE STATUS:DNR    TOTAL TIME TAKING CARE OF THIS PATIENT: 50 minutes.    Altamese DillingVACHHANI, Seri Kimmer M.D on 07/07/2014   Between 7am to 6pm - Pager - [phone]  After 6pm go to [email or web address] - password [email or web address] [facility]  [facility] [facility]  Office  [phone]  CC: Primary care physician; No PCP Per Patient

## 2014-07-07 NOTE — Plan of Care (Signed)
Problem: Discharge Progression Outcomes Goal: Activity appropriate for discharge plan Outcome: Progressing Pt weak right now, BSC. Possible PT evaluation.

## 2014-07-07 NOTE — Plan of Care (Signed)
Problem: Discharge Progression Outcomes Goal: Tolerating diet Outcome: Not Progressing Pt tries to take sips of water and bites of food but becomes naueseatedand vomits, Zofran given with noted relief Will advance diet as tolerated, continue IV fluids at 75 ml and monitor labs for dehydration

## 2014-07-08 LAB — BASIC METABOLIC PANEL
Anion gap: 12 (ref 5–15)
BUN: 42 mg/dL — ABNORMAL HIGH (ref 6–20)
CO2: 24 mmol/L (ref 22–32)
Calcium: 8.5 mg/dL — ABNORMAL LOW (ref 8.9–10.3)
Chloride: 104 mmol/L (ref 101–111)
Creatinine, Ser: 1.74 mg/dL — ABNORMAL HIGH (ref 0.44–1.00)
GFR, EST AFRICAN AMERICAN: 32 mL/min — AB (ref >60)
GFR, EST NON AFRICAN AMERICAN: 27 mL/min — AB (ref >60)
Glucose, Bld: 122 mg/dL — ABNORMAL HIGH (ref 65–99)
POTASSIUM: 2.8 mmol/L — AB (ref 3.5–5.1)
SODIUM: 140 mmol/L (ref 135–145)

## 2014-07-08 LAB — CBC
HEMATOCRIT: 39.3 % (ref 35.0–47.0)
Hemoglobin: 13 g/dL (ref 12.0–16.0)
MCH: 30.2 pg (ref 26.0–34.0)
MCHC: 33.2 g/dL (ref 32.0–36.0)
MCV: 91.2 fL (ref 80.0–100.0)
Platelets: 87 10*3/uL — ABNORMAL LOW (ref 150–440)
RBC: 4.31 MIL/uL (ref 3.80–5.20)
RDW: 13.8 % (ref 11.5–14.5)
WBC: 3.2 10*3/uL — ABNORMAL LOW (ref 3.6–11.0)

## 2014-07-08 LAB — GLUCOSE, CAPILLARY
GLUCOSE-CAPILLARY: 114 mg/dL — AB (ref 65–99)
GLUCOSE-CAPILLARY: 155 mg/dL — AB (ref 65–99)
GLUCOSE-CAPILLARY: 100 mg/dL — AB (ref 65–99)
Glucose-Capillary: 120 mg/dL — ABNORMAL HIGH (ref 65–99)

## 2014-07-08 LAB — MAGNESIUM: Magnesium: 1.9 mg/dL (ref 1.7–2.4)

## 2014-07-08 MED ORDER — OXYCODONE HCL 5 MG PO TABS
5.0000 mg | ORAL_TABLET | ORAL | Status: DC | PRN
  Administered 2014-07-13: 5 mg via ORAL
  Filled 2014-07-08 (×2): qty 1

## 2014-07-08 MED ORDER — MORPHINE SULFATE 2 MG/ML IJ SOLN
2.0000 mg | INTRAMUSCULAR | Status: DC | PRN
  Administered 2014-07-08: 2 mg via INTRAVENOUS
  Filled 2014-07-08: qty 1

## 2014-07-08 MED ORDER — POTASSIUM CHLORIDE 10 MEQ/100ML IV SOLN
10.0000 meq | INTRAVENOUS | Status: AC
  Administered 2014-07-08 (×2): 10 meq via INTRAVENOUS
  Filled 2014-07-08 (×4): qty 100

## 2014-07-08 MED ORDER — OXYCODONE-ACETAMINOPHEN 5-325 MG PO TABS: 1.0000 | ORAL_TABLET | ORAL | Status: DC | PRN

## 2014-07-08 MED ORDER — POTASSIUM CHLORIDE IN NACL 40-0.9 MEQ/L-% IV SOLN
INTRAVENOUS | Status: DC
  Administered 2014-07-08 – 2014-07-10 (×4): 50 mL/h via INTRAVENOUS
  Filled 2014-07-08 (×6): qty 1000

## 2014-07-08 MED ORDER — ALUM & MAG HYDROXIDE-SIMETH 200-200-20 MG/5ML PO SUSP
30.0000 mL | Freq: Four times a day (QID) | ORAL | Status: DC | PRN
  Administered 2014-07-09 – 2014-07-13 (×2): 30 mL via ORAL
  Filled 2014-07-08 (×3): qty 30

## 2014-07-08 MED ORDER — POTASSIUM CHLORIDE 20 MEQ/15ML (10%) PO SOLN
40.0000 meq | Freq: Once | ORAL | Status: AC
  Administered 2014-07-08: 40 meq via ORAL
  Filled 2014-07-08: qty 30

## 2014-07-08 MED ORDER — ACETAMINOPHEN 325 MG PO TABS
650.0000 mg | ORAL_TABLET | Freq: Four times a day (QID) | ORAL | Status: DC | PRN
  Administered 2014-07-08 – 2014-07-09 (×2): 650 mg via ORAL
  Filled 2014-07-08 (×2): qty 2

## 2014-07-08 MED ORDER — CIPROFLOXACIN HCL 500 MG PO TABS
500.0000 mg | ORAL_TABLET | Freq: Two times a day (BID) | ORAL | Status: DC
  Administered 2014-07-08: 500 mg via ORAL
  Filled 2014-07-08: qty 1

## 2014-07-08 MED ORDER — SODIUM CHLORIDE 0.9 % IV SOLN
INTRAVENOUS | Status: DC
  Administered 2014-07-08: 07:00:00 via INTRAVENOUS
  Filled 2014-07-08 (×2): qty 1000

## 2014-07-08 MED ORDER — CIPROFLOXACIN HCL 250 MG PO TABS
250.0000 mg | ORAL_TABLET | Freq: Two times a day (BID) | ORAL | Status: DC
  Administered 2014-07-09 – 2014-07-11 (×5): 250 mg via ORAL
  Filled 2014-07-08 (×6): qty 1

## 2014-07-08 NOTE — Clinical Social Work Note (Signed)
CSW attempted to speak to pt.  She was sleeping and would not wake when called.  CSW also attempted to contact emergency contacts.  Mobile # was disconnected.  CSW left a general VM for Home #.  CS[facility], again CSW was only able to leave a general VM asking CSW to call [facility] back.

## 2014-07-08 NOTE — Plan of Care (Signed)
Problem: Discharge Progression Outcomes Goal: Discharge plan in place and appropriate Outcome: Progressing Individualization of Care; pt opposed to going to rehab but strongly encouraged pt b/c of weakness.  Pt experiencing incontinence - still having loose stools.

## 2014-07-08 NOTE — Progress Notes (Addendum)
[facility] [place] at [facility]   PATIENT NAME: Melanie Wang    MR#:  [id number]  DATE OF BIRTH:  07/14/1935  SUBJECTIVE:  Patient here with n/v and diarrhea no abdominal pain No blood in stools  REVIEW OF SYSTEMS:    Review of Systems  Constitutional: Positive for fever and malaise/fatigue. Negative for chills.  Respiratory: Negative for cough.   Cardiovascular: Negative for chest pain.  Gastrointestinal: Positive for nausea, vomiting and diarrhea. Negative for abdominal pain.  Neurological: Negative for focal weakness and headaches.    Tolerating Diet: clear liquid diet      DRUG ALLERGIES:   Allergies  Allergen Reactions  . Flagyl [Metronidazole] Hives  . Gabapentin Other (See Comments)    Reaction:  Unknown  . Glucophage [Metformin Hcl] Diarrhea  . Plavix [Clopidogrel] Other (See Comments)    Reaction:  Unknown  . Shellfish Allergy Other (See Comments)    Reaction:  Unknown  . Synthroid [Levothyroxine] Other (See Comments)    Reaction:  Unknown  . Naprosyn [Naproxen] Itching and Rash  . Sulfa Antibiotics Rash    VITALS:  Blood pressure 125/58, pulse 103, temperature 98.2 F (36.8 C), temperature source Oral, resp. rate 20, height 5\' 4"  (1.626 m), weight 80.65 kg (177 lb 12.8 oz), SpO2 98 %.  PHYSICAL EXAMINATION:   Physical Exam  Constitutional: She is well-developed, well-nourished, and in no distress. No distress.  HENT:  Head: Normocephalic and atraumatic.  Neck: Neck supple. No JVD present. No tracheal deviation present.  Cardiovascular: Normal rate, regular rhythm and normal heart sounds.   No murmur heard. Pulmonary/Chest: Effort normal and breath sounds normal. No respiratory distress. She has no wheezes.  Abdominal: Soft. She exhibits no distension. There is no tenderness. There is no rebound.  Musculoskeletal: Normal range of motion. She exhibits edema.  Neurological: She is alert.  Skin: Skin is warm and dry. No  erythema.  Psychiatric: Affect normal.      LABORATORY PANEL:   CBC  Recent Labs Lab 07/08/14 0525  WBC 3.2*  HGB 13.0  HCT 39.3  PLT 87*   ------------------------------------------------------------------------------------------------------------------  Chemistries   Recent Labs Lab 07/07/14 1822  07/08/14 0525  NA 139  --  140  K 3.3*  --  2.8*  CL 102  --  104  CO2 25  --  24  GLUCOSE 119*  --  122*  BUN 37*  --  42*  CREATININE 2.00*  < > 1.74*  CALCIUM 8.7*  --  8.5*  MG  --   --  1.9  AST 36  --   --   ALT 17  --   --   ALKPHOS 50  --   --   BILITOT 0.8  --   --   < > = values in this interval not displayed. ------------------------------------------------------------------------------------------------------------------  Cardiac Enzymes No results for input(s): TROPONINI in the last 168 hours. ------------------------------------------------------------------------------------------------------------------  RADIOLOGY:  No results found.   ASSESSMENT AND PLAN:  79 y/o f here with n/v/and diarrhea.  1. Diarrhea: I suspect this is related to a viral etiology or bacterial etiology. Her C. difficile is negative. I will therefore DC vancomycin. I started ciprofloxacin. Please follow up on stool cultures. She also had a fever this morning which may be more concerning for bacterial in etiology.  2. Hypokalemia: This is due to diarrhea and vomiting. Patient is receiving IV fluids with potassium. I have also replace potassium orally.  3. Diabetes: Patient  is on a clear liquid diet. For now I will continue with sliding scale insulin.  4. Hypertension, essential: Patient's blood pressure is stable. She is off of her blood pressure medications due to her nausea and vomiting. At this time since her blood pressures are within normal limits I will not restart the medications. I will continue to monitor blood pressure.  5. COPD: Patient does not appear to be in  acute exacerbation at this time. We will continue with her inhalers.  6. LOW PLATLETS: possibly from diarrhea and acute illness. HOLD HEPARIN and CBC in am    Management plans discussed with the patient and she is in agreement.  CODE STATUS: DNR  TOTAL TIME TAKING CARE OF THIS PATIENT: 31 minutes.   POSSIBLE D/C IN 1-2 DAYS, DEPENDING ON CLINICAL CONDITION.   Lynnita Somma M.D on 07/08/2014 at 12:03 PM  Between 7am to 6pm - Pager - [phone] After 6pm go to [email or web address] - password [email or web address] [facility]  [facility]  Office  [phone]  CC: Primary care physician; No PCP Per Patient

## 2014-07-08 NOTE — Plan of Care (Signed)
Problem: Discharge Progression Outcomes Goal: Other Discharge Outcomes/Goals Outcome: Progressing Plan of Care Progression to Goal:  Pt tolerating diet.  No antiemetics required.  Continues to have incontinent bouts of loose stool. Got up to BSC 1x.  Pt rested most of shift.

## 2014-07-08 NOTE — Progress Notes (Signed)
Stool culture ordered by prime doc, pt has had 5-7 loose stools and vomited up a green/yellow bile. Pt may need to be made NPO.

## 2014-07-08 NOTE — Progress Notes (Signed)
Notified Dr. Betti Cruzeddy of critical potassium of 2.8, orders received.

## 2014-07-09 ENCOUNTER — Encounter: Payer: Self-pay | Admitting: Gastroenterology

## 2014-07-09 LAB — BASIC METABOLIC PANEL
Anion gap: 9 (ref 5–15)
BUN: 38 mg/dL — AB (ref 6–20)
CO2: 25 mmol/L (ref 22–32)
CREATININE: 1.51 mg/dL — AB (ref 0.44–1.00)
Calcium: 8.2 mg/dL — ABNORMAL LOW (ref 8.9–10.3)
Chloride: 107 mmol/L (ref 101–111)
GFR calc Af Amer: 38 mL/min — ABNORMAL LOW (ref >60)
GFR, EST NON AFRICAN AMERICAN: 32 mL/min — AB (ref >60)
Glucose, Bld: 111 mg/dL — ABNORMAL HIGH (ref 65–99)
Potassium: 3 mmol/L — ABNORMAL LOW (ref 3.5–5.1)
Sodium: 141 mmol/L (ref 135–145)

## 2014-07-09 LAB — GLUCOSE, CAPILLARY
GLUCOSE-CAPILLARY: 138 mg/dL — AB (ref 65–99)
Glucose-Capillary: 96 mg/dL (ref 65–99)
Glucose-Capillary: 114 mg/dL — ABNORMAL HIGH (ref 65–99)
Glucose-Capillary: 94 mg/dL (ref 65–99)

## 2014-07-09 MED ORDER — POTASSIUM CHLORIDE CRYS ER 20 MEQ PO TBCR
40.0000 meq | EXTENDED_RELEASE_TABLET | Freq: Two times a day (BID) | ORAL | Status: DC
  Administered 2014-07-09 – 2014-07-12 (×7): 40 meq via ORAL
  Filled 2014-07-09 (×7): qty 2

## 2014-07-09 NOTE — Progress Notes (Signed)
[facility] [place] at [facility]   PATIENT NAME: Melanie Wang    MR#:  [id number]  DATE OF BIRTH:  10/22/1935  SUBJECTIVE:  Patient here with n/v and diarrhea no abdominal pain No blood in stools Still have vomiting and diarrhea. REVIEW OF SYSTEMS:    ROS Constitutional: negative for fever and positive for malaise/fatigue. Negative for chills.  Respiratory: Negative for cough.  Cardiovascular: Negative for chest pain.  Gastrointestinal: Positive for nausea, vomiting and diarrhea. Negative for abdominal pain.  Neurological: Negative for focal weakness and headaches.  Tolerating Diet: clear liquid diet  DRUG ALLERGIES:   Allergies  Allergen Reactions  . Flagyl [Metronidazole] Hives  . Gabapentin Other (See Comments)    Reaction:  Unknown  . Glucophage [Metformin Hcl] Diarrhea  . Plavix [Clopidogrel] Other (See Comments)    Reaction:  Unknown  . Shellfish Allergy Other (See Comments)    Reaction:  Unknown  . Synthroid [Levothyroxine] Other (See Comments)    Reaction:  Unknown  . Naprosyn [Naproxen] Itching and Rash  . Sulfa Antibiotics Rash    VITALS:  Blood pressure 125/61, pulse 88, temperature 99.2 F (37.3 C), temperature source Oral, resp. rate 18, height 5\' 4"  (1.626 m), weight 80.65 kg (177 lb 12.8 oz), SpO2 98 %.  PHYSICAL EXAMINATION:   Physical Exam  Constitutional: She is well-developed, well-nourished, and in no distress. No distress.  HENT:  Head: Normocephalic and atraumatic.  Neck: Neck supple. No JVD present. No tracheal deviation present.  Cardiovascular: Normal rate, regular rhythm and normal heart sounds.   No murmur heard. Pulmonary/Chest: Effort normal and breath sounds normal. No respiratory distress. She has no wheezes.  Abdominal: Soft. She exhibits no distension. There is no tenderness. There is no rebound.  Musculoskeletal: Normal range of motion. She exhibits edema.  Neurological: She is alert.  Skin: Skin is  warm and dry. No erythema.  Psychiatric: Affect normal.    LABORATORY PANEL:   CBC  Recent Labs Lab 07/08/14 0525  WBC 3.2*  HGB 13.0  HCT 39.3  PLT 87*   ------------------------------------------------------------------------------------------------------------------  Chemistries   Recent Labs Lab 07/07/14 1822  07/08/14 0525 07/09/14 0507  NA 139  --  140 141  K 3.3*  --  2.8* 3.0*  CL 102  --  104 107  CO2 25  --  24 25  GLUCOSE 119*  --  122* 111*  BUN 37*  --  42* 38*  CREATININE 2.00*  < > 1.74* 1.51*  CALCIUM 8.7*  --  8.5* 8.2*  MG  --   --  1.9  --   AST 36  --   --   --   ALT 17  --   --   --   ALKPHOS 50  --   --   --   BILITOT 0.8  --   --   --   < > = values in this interval not displayed. ------------------------------------------------------------------------------------------------------------------  Cardiac Enzymes No results for input(s): TROPONINI in the last 168 hours. ------------------------------------------------------------------------------------------------------------------   ASSESSMENT AND PLAN:  79 y/o f here with n/v/and diarrhea.  1. Diarrhea: I suspect this is related to a viral etiology or bacterial etiology. Her C. difficile is negative. so DC vancomycin. started ciprofloxacin. Please follow up on stool cultures- still pending .    Still same diarrhea and vomit- called GI consult.  2. Hypokalemia: This is due to diarrhea and vomiting. Patient is receiving IV fluids with potassium.  3. Diabetes: Patient is on a clear liquid diet. For now I will continue with sliding scale insulin.  4. Hypertension, essential: Patient's blood pressure is stable. She is off of her blood pressure medications due to her nausea and vomiting. At this time since her blood pressures are within normal limits I will not restart the medications. I will continue to monitor blood pressure.  5. COPD: Patient does not appear to be in acute exacerbation at  this time. We will continue with her inhalers.  6. LOW PLATLETS: possibly from diarrhea and acute illness. HOLD HEPARIN and continue to follow CBC.    Management plans discussed with the patient and she is in agreement.  CODE STATUS: DNR  TOTAL TIME TAKING CARE OF THIS PATIENT: 30minutes.   POSSIBLE D/C IN 1-2 DAYS, DEPENDING ON CLINICAL CONDITION.   Altamese DillingVACHHANI, Brynlynn Walko M.D on 07/09/2014 at 3:27 PM  Between 7am to 6pm - Pager - [phone] After 6pm go to [email or web address] - password [email or web address] [facility]  [facility]  Office  [phone]  CC: Primary care physician; No PCP Per Patient

## 2014-07-09 NOTE — Plan of Care (Signed)
Problem: Discharge Progression Outcomes Goal: Discharge plan in place and appropriate Individualization: Pt still having nausea, vomiting and diarrhea. GI consult today - want to continue observation until stool culture comes in.  Pt is followed by [facility].

## 2014-07-09 NOTE — Progress Notes (Signed)
Dr. Winona LegatoVaickute notified about patient unable to tolerate PO pain medication. MD to put in orders. Patient requested something to stop the diarrhea, MD did not put orders due to the diarrhea being possible bacterial infection.

## 2014-07-09 NOTE — Consult Note (Signed)
  GI Inpatient Follow-up Note  Patient Identification: Melanie Wang is a 79 y.o. female  Subjective: Pt with N/V/D. Please see D.Martin's notes. Pt is poor historian. Occasional dysphagia. Hx of esophageal stricture? C.diff. Stool cx so far neg. Now on cipro. Vanco stopped when c.diff came back neg.  Scheduled Inpatient Medications:  . aspirin EC  81 mg Oral Daily  . ciprofloxacin  250 mg Oral BID  . clonazePAM  1 mg Oral BID  . insulin aspart  0-9 Units Subcutaneous TID WC  . levothyroxine  100 mcg Oral QAC breakfast  . mometasone-formoterol  2 puff Inhalation BID  . pantoprazole  40 mg Oral BID  . potassium chloride  40 mEq Oral BID  . pravastatin  40 mg Oral Daily  . sertraline  150 mg Oral Daily  . tiotropium  18 mcg Inhalation Daily    Continuous Inpatient Infusions:   . 0.9 % NaCl with KCl 40 mEq / L 50 mL/hr (07/09/14 0706)    PRN Inpatient Medications:  acetaminophen, albuterol, alum & mag hydroxide-simeth, fluticasone, morphine injection, ondansetron **OR** ondansetron (ZOFRAN) IV, oxyCODONE-acetaminophen **AND** oxyCODONE, promethazine  Review of Systems: Constitutional: Weight is stable.  Eyes: No changes in vision. ENT: No oral lesions, sore throat.  GI: see HPI.  Heme/Lymph: No easy bruising.  CV: No chest pain.  GU: No hematuria.  Integumentary: No rashes.  Neuro: No headaches.  Psych: No depression/anxiety.  Endocrine: No heat/cold intolerance.  Allergic/Immunologic: No urticaria.  Resp: No cough, SOB.  Musculoskeletal: No joint swelling.    Physical Examination: BP 125/61 mmHg  Pulse 88  Temp(Src) 99.2 F (37.3 C) (Oral)  Resp 18  Ht 5\' 4"  (1.626 m)  Wt 80.65 kg (177 lb 12.8 oz)  BMI 30.50 kg/m2  SpO2 98% Gen: NAD, alert and oriented x 4 HEENT: PEERLA, EOMI, Neck: supple, no JVD or thyromegaly Chest: CTA bilaterally, no wheezes, crackles, or other adventitious sounds CV: RRR, no m/g/c/r Abd: soft, NT, ND, +BS in all four quadrants; no HSM,  guarding, ridigity, or rebound tenderness Ext: no edema, well perfused with 2+ pulses, Skin: no rash or lesions noted Lymph: no LAD  Data: Lab Results  Component Value Date   WBC 3.2* 07/08/2014   HGB 13.0 07/08/2014   HCT 39.3 07/08/2014   MCV 91.2 07/08/2014   PLT 87* 07/08/2014    Recent Labs Lab 07/07/14 1822 07/07/14 1919 07/08/14 0525  HGB 13.6 13.3 13.0   Lab Results  Component Value Date   NA 141 07/09/2014   K 3.0* 07/09/2014   CL 107 07/09/2014   CO2 25 07/09/2014   BUN 38* 07/09/2014   CREATININE 1.51* 07/09/2014   Lab Results  Component Value Date   ALT 17 07/07/2014   AST 36 07/07/2014   ALKPHOS 50 07/07/2014   BILITOT 0.8 07/07/2014   No results for input(s): APTT, INR, PTT in the last 168 hours. Assessment/Plan: Melanie Wang is a 79 y.o. female with N/V/D   Recommendations: Need to review previous EGD/UGI/colonoscopy reports, which are not available in epic. Continue supportive care. IV hydration. Replete K if low again. If final stool cx neg, then ok to give imodium prn if diarrhea persists. Repeat EGD/colon later only if symptoms improve over next few days. Will follow. Thanks. Please call with questions or concerns.  Charlissa Petros, Ezzard StandingPAUL Y, MD

## 2014-07-09 NOTE — Progress Notes (Signed)
PT Cancellation Note  Patient Details Name: Melanie Wang MRN: [name] DOB: 06/01/1935   Cancelled Treatment:    Reason Eval/Treat Not Completed: Medical issues which prohibited therapy. Chart reviewed, RN consulted. Holding pt treatment at this time due to RN reports of patient being in continued GI distress. Will attempt at later date/time.     Buccola,Allan C 07/09/2014, 3:05 PM  Rosamaria LintsAllan C Buccola, PT, DPT, BM

## 2014-07-09 NOTE — Plan of Care (Signed)
Problem: Discharge Progression Outcomes Goal: Other Discharge Outcomes/Goals Outcome: Not Progressing No improvement in N,V or diarrhea.  Pt also experienced severe leg cramps. Took mustard and cramps improved. GI consult.   Unable to work w/PT - still need eval for d/c planning.

## 2014-07-09 NOTE — Progress Notes (Signed)
   07/09/14 1100  Clinical Encounter Type  Visited With Patient  Visit Type Initial  Referral From Nurse  Spiritual Encounters  Spiritual Needs Prayer  Stress Factors  Patient Stress Factors Major life changes  Family Stress Factors Loss of control  Nurse paged Chaplain Razia Screws to visit patient. Patient appeared to be in pain and anxious. Chaplain offered support and care. Maisie Fushomas [phone]

## 2014-07-09 NOTE — Care Management (Signed)
Patient is open to [facility] services. [phone]. I attempted to meet with patient but she was nauseated and couldn't talk. RN concerned about patient mobility status; PT requested. Patient from home alone where she was independent with daily activities per  RN.  [facility] will assess and take care of patient needs at discharge. RNCM to assist with needs.

## 2014-07-09 NOTE — Clinical Social Work Note (Signed)
Clinical Social Work Assessment  Patient Details  Name: Melanie Wang MRN: [id number] Date of Birth: 12/16/1935  Date of referral:  07/08/14               Reason for consult:  Facility Placement                Permission sought to share information with:  Facility Medical sales representativeContact Representative, Family Supports Permission granted to share information::  No (pt was heavily sleeping when CSW attempted to speak to pt.  SHe would not wake when called.  CSW attempted  family/ emergency contacts first then [facility] contacts.)  Name::        Agency::     Relationship::     Contact Information:     Housing/Transportation Living arrangements for the past 2 months:    Source of Information:  Medical Team, Adult Children, Other (Comment Required) ([facility]) Patient Interpreter Needed:  None Criminal Activity/Legal Involvement Pertinent to Current Situation/Hospitalization:  No - Comment as needed Significant Relationships:  Adult Children, Other(Comment) ([facility] program) Lives with:    Do you feel safe going back to the place where you live?    Need for family participation in patient care:  Yes (Comment)  Care giving concerns:  Post acute placement and social supports   Social Worker assessment / plan:  CSW was able to speak to pt's son Melanie Wang at the end of the day yesterday.  [phone].  He is in agreement with [facility]  This is the only facility that [facility].  Employment status:  Retired Health and safety inspectornsurance information:  Medicare PT Recommendations:    Information / Referral to community resources:     Patient/Family's Response to care:  [facility]'s [facility] in [facility]   Patient/Family's Understanding of and Emotional Response to Diagnosis, Current Treatment, and Prognosis:  Pt's [facility] in agreement [facility]   Emotional Assessment Appearance:  Appears older than stated age Attitude/Demeanor/Rapport:   (pt was sleeping when CSW attempted to  assess her.) Affect (typically observed):    Orientation:    Alcohol / Substance use:  Never Used Psych involvement (Current and /or in the community):  No (Comment)  Discharge Needs  Concerns to be addressed:  Care Coordination Readmission within the last 30 days:  No Current discharge risk:    Barriers to Discharge:  No SNF bed, Other (CSW currently doing bed search not.  )   Chauncy PassyBennerson, Zhane Donlan J, LCSW 07/09/2014, 8:51 AM

## 2014-07-09 NOTE — Consult Note (Signed)
GI Inpatient Consult Note  Reason for Consult: Vomiting and Diarrhea   Attending Requesting Consult: Dr. Altamese DillingVaibhavkumar Vachhani  History of Present Illness: Melanie Wang is a 79 y.o. female who has been admitted with Nausea, Vomiting and Diarrhea.  She was [facility] directly from [facility] [place] with a three day onset of diarrhea and vomiting.  Unable to keep anything down since last Friday per patient.  She was given oral Vancomycin and stool sent for c-diff.  Vancomycin has since been discontinued with a negative c-difficile stool study.  Diarrhea persist therefore Cipro has been started. Stool culture results pending. No recent antibiotics noted in [facility]   Her nausea responds to Zofran and Phenergan but she is needing them frequently.  She is currently receiving IV fluids with potassium and oral potassium.  Her level is 3.0 today.  Platelets were 87,000 on yesterday. They were 95,000 on admission.   She has a past medical history of Gastroesophageal Reflux Disease, Irritable Bowel Syndrome, Anemia, Dysphagia, adenomatous polyps and diverticulosis.   Past Medical History:  Past Medical History  Diagnosis Date  . Allergy   . Anemia   . Arthritis   . COPD (chronic obstructive pulmonary disease)   . Diabetes mellitus without complication   . GERD (gastroesophageal reflux disease)   . Hypertension     Problem List: Patient Active Problem List   Diagnosis Date Noted  . Diarrhea 07/07/2014  . Dehydration 07/07/2014    Past Surgical History: History reviewed. No pertinent past surgical history.  Allergies: Allergies  Allergen Reactions  . Flagyl [Metronidazole] Hives  . Gabapentin Other (See Comments)    Reaction:  Unknown  . Glucophage [Metformin Hcl] Diarrhea  . Plavix [Clopidogrel] Other (See Comments)    Reaction:  Unknown  . Shellfish Allergy Other (See Comments)    Reaction:  Unknown  . Synthroid [Levothyroxine] Other (See  Comments)    Reaction:  Unknown  . Naprosyn [Naproxen] Itching and Rash  . Sulfa Antibiotics Rash    Home Medications: Prescriptions prior to admission  Medication Sig Dispense Refill Last Dose  . acetaminophen (TYLENOL) 650 MG CR tablet Take 650 mg by mouth every 12 (twelve) hours as needed for pain.   PRN at PRN  . albuterol (PROVENTIL HFA;VENTOLIN HFA) 108 (90 BASE) MCG/ACT inhaler Inhale 2 puffs into the lungs 4 (four) times daily as needed for shortness of breath.   PRN at PRN  . albuterol (PROVENTIL) (2.5 MG/3ML) 0.083% nebulizer solution Take 2.5 mg by nebulization every 4 (four) hours as needed for wheezing or shortness of breath (for cough).   PRN at PRN  . aluminum-magnesium hydroxide-simethicone (MAALOX) 200-200-20 MG/5ML SUSP Take 30 mLs by mouth every 6 (six) hours as needed (for heartburn).   PRN at PRN  . aspirin EC 81 MG tablet Take 81 mg by mouth daily.   unknown at unknown  . clonazePAM (KLONOPIN) 1 MG tablet Take 1 mg by mouth 2 (two) times daily.   unknown at unknown  . dicyclomine (BENTYL) 10 MG capsule Take 10 mg by mouth 2 (two) times daily as needed for spasms.   PRN at PRN  . ergocalciferol (VITAMIN D2) 50000 UNITS capsule Take 50,000 Units by mouth every 30 (thirty) days. Pt takes on the first Monday of each month.   unknown at unknown  . fluticasone (FLONASE) 50 MCG/ACT nasal spray Place 1 spray into both nostrils daily as needed for allergies.   PRN at PRN  .  fluticasone-salmeterol (ADVAIR HFA) 230-21 MCG/ACT inhaler Inhale 2 puffs into the lungs every 12 (twelve) hours.   unknown at unknown  . furosemide (LASIX) 20 MG tablet Take 20 mg by mouth daily as needed (for weight gain greater than 2lbs in one day or 5lbs in one week).   PRN at PRN  . guaiFENesin-dextromethorphan (ROBITUSSIN DM) 100-10 MG/5ML syrup Take 10 mLs by mouth every 4 (four) hours as needed for cough.   PRN at PRN  . HYDROcodone-acetaminophen (NORCO/VICODIN) 5-325 MG per tablet Take 1 tablet by mouth  3 (three) times daily as needed for moderate pain.   PRN at PRN  . hydrocortisone (ANUSOL-HC) 25 MG suppository Place 25 mg rectally at bedtime as needed for hemorrhoids.   PRN at PRN  . ipratropium-albuterol (DUONEB) 0.5-2.5 (3) MG/3ML SOLN Take 3 mLs by nebulization every 4 (four) hours as needed (for shortness of breath).   PRN at PRN  . levothyroxine (SYNTHROID, LEVOTHROID) 100 MCG tablet Take 100 mcg by mouth daily.   unknown at unknown  . lidocaine (LIDODERM) 5 % Place 1 patch onto the skin daily. Remove & Discard patch within 12 hours or as directed by MD   unknown at unknown  . loratadine (CLARITIN) 10 MG tablet Take 10 mg by mouth daily as needed for allergies.   PRN at PRN  . lubiprostone (AMITIZA) 8 MCG capsule Take 8 mcg by mouth every morning.   unknown at unknown  . meloxicam (MOBIC) 7.5 MG tablet Take 7.5 mg by mouth daily.   unknown at unknown  . nitroGLYCERIN (NITROSTAT) 0.4 MG SL tablet Place 0.4 mg under the tongue every 5 (five) minutes as needed for chest pain.   PRN at PRN  . ondansetron (ZOFRAN) 4 MG tablet Take 4 mg by mouth every 8 (eight) hours as needed for nausea or vomiting.   PRN at PRN  . ondansetron (ZOFRAN-ODT) 4 MG disintegrating tablet Take 4 mg by mouth every 8 (eight) hours as needed for nausea or vomiting.   PRN at PRN  . pantoprazole (PROTONIX) 40 MG tablet Take 40 mg by mouth 2 (two) times daily.   unknown at unknown  . phenazopyridine (PYRIDIUM) 100 MG tablet Take 100 mg by mouth 3 (three) times daily as needed for pain.   PRN at PRN  . polyethylene glycol (MIRALAX / GLYCOLAX) packet Take 17 g by mouth daily.   unknown at unknown  . potassium chloride (K-DUR) 10 MEQ tablet Take 10 mEq by mouth daily.   unknown at unknown  . pravastatin (PRAVACHOL) 40 MG tablet Take 40 mg by mouth daily.   unknown at unknown  . predniSONE (DELTASONE) 20 MG tablet Take 20 mg by mouth 3 (three) times daily as needed (for breathing problems).   PRN at PRN  . ranitidine (ZANTAC)  150 MG tablet Take 150 mg by mouth daily.   unknown at unknown  . sertraline (ZOLOFT) 100 MG tablet Take 150 mg by mouth daily.   unknown at unknown  . Tiotropium Bromide Monohydrate 2.5 MCG/ACT AERS Inhale 2 puffs into the lungs daily.   unknown at unknown  . zafirlukast (ACCOLATE) 20 MG tablet Take 20 mg by mouth 2 (two) times daily.   unknown at unknown   Home medication reconciliation was completed with the patient.   Scheduled Inpatient Medications:   . aspirin EC  81 mg Oral Daily  . ciprofloxacin  250 mg Oral BID  . clonazePAM  1 mg Oral BID  . insulin  aspart  0-9 Units Subcutaneous TID WC  . levothyroxine  100 mcg Oral QAC breakfast  . mometasone-formoterol  2 puff Inhalation BID  . pantoprazole  40 mg Oral BID  . potassium chloride  40 mEq Oral BID  . pravastatin  40 mg Oral Daily  . sertraline  150 mg Oral Daily  . tiotropium  18 mcg Inhalation Daily    Continuous Inpatient Infusions:   . 0.9 % NaCl with KCl 40 mEq / L 50 mL/hr (07/09/14 0706)    PRN Inpatient Medications:  acetaminophen, albuterol, alum & mag hydroxide-simeth, fluticasone, morphine injection, ondansetron **OR** ondansetron (ZOFRAN) IV, oxyCODONE-acetaminophen **AND** oxyCODONE, promethazine  Family History: family history includes Asthma in her sister.  The patient's family history is negative for inflammatory bowel disorders, GI malignancy, or solid organ transplantation.  Social History:   reports that she has quit smoking. She has never used smokeless tobacco. She reports that she does not drink alcohol or use illicit drugs. The patient denies ETOH, tobacco, or drug use.    Review of Systems: Constitutional: Weight is stable.  Eyes: No changes in vision. ENT: No oral lesions, sore throat.  GI: see HPI.  Heme/Lymph: No easy bruising.  CV: No chest pain.  GU: No hematuria.  Integumentary: No rashes.  Neuro: No headaches.  Psych: No depression/anxiety.  Endocrine: No heat/cold intolerance.   Allergic/Immunologic: No urticaria.  Resp: No cough, SOB.  Musculoskeletal: No joint swelling.    Physical Examination: BP 131/55 mmHg  Pulse 83  Temp(Src) 98.1 F (36.7 C) (Oral)  Resp 18  Ht  (1.626 m)  Wt 80.65 kg (177 lb 12.8 oz)  BMI 30.50 kg/m2  SpO2 93% Gen: NAD, alert and oriented x 4 HEENT: Sclera clear.  EOMI. Oral mucosa slightly dry.  Neck: supple, no JVD or thyromegaly Chest: CTA bilaterally, no wheezes, crackles, or other adventitious sounds CV: RRR, no m/g/c/r Abd: soft, mild generalize tenderness on palpation, ND, +BS in all four quadrants; no HSM, guarding, ridigity, or rebound tenderness Ext: no edema, well perfused with 2+ pulses, Skin: no rash or lesions noted Lymph: no LAD  Data: Lab Results  Component Value Date   WBC 3.2* 07/08/2014   HGB 13.0 07/08/2014   HCT 39.3 07/08/2014   MCV 91.2 07/08/2014   PLT 87* 07/08/2014    Recent Labs Lab 07/07/14 1822 07/07/14 1919 07/08/14 0525  HGB 13.6 13.3 13.0   Lab Results  Component Value Date   NA 141 07/09/2014   K 3.0* 07/09/2014   CL 107 07/09/2014   CO2 25 07/09/2014   BUN 38* 07/09/2014   CREATININE 1.51* 07/09/2014   Lab Results  Component Value Date   ALT 17 07/07/2014   AST 36 07/07/2014   ALKPHOS 50 07/07/2014   BILITOT 0.8 07/07/2014   No results for input(s): APTT, INR, PTT in the last 168 hours. Assessment/Plan: Ms. Stallworth is a 79 y.o. female with acute onset of nausea, vomiting and diarrhea. Etiology unclear.  C-diff stool results negative. Awaiting stool culture results.   Recommendations:  Stool cultures pending. Continue with Zofran, Phenergan and IVF.  Will follow closely for now.  If symptoms don't improve, Endoscopy maybe helpful.  Thank you for the consult. Please call with questions or concerns. Patient seen with Dr. Lutricia Feil under collaborative agreement.  Daryll Drown, FNP

## 2014-07-09 NOTE — Plan of Care (Signed)
Problem: Discharge Progression Outcomes Goal: Discharge plan in place and appropriate Outcome: Progressing Patient is a High Fall Risk, [facility] From home alone Hx of HTN, GERD, COPD, DM, anemia. Continue with home meds. Goal: Other Discharge Outcomes/Goals Outcome: Progressing Patient is alert and oriented. Took night time medication, shortly after threw up 400 ml and had diarrhea 400 ml, Zofran given with improvement. Morphine IV given with improvement. Patient now resting quietly, continue to assess.

## 2014-07-10 LAB — BASIC METABOLIC PANEL
ANION GAP: 7 (ref 5–15)
BUN: 30 mg/dL — AB (ref 6–20)
CO2: 22 mmol/L (ref 22–32)
Calcium: 8.7 mg/dL — ABNORMAL LOW (ref 8.9–10.3)
Chloride: 114 mmol/L — ABNORMAL HIGH (ref 101–111)
Creatinine, Ser: 1.2 mg/dL — ABNORMAL HIGH (ref 0.44–1.00)
GFR calc non Af Amer: 43 mL/min — ABNORMAL LOW (ref >60)
GFR, EST AFRICAN AMERICAN: 50 mL/min — AB (ref >60)
Glucose, Bld: 112 mg/dL — ABNORMAL HIGH (ref 65–99)
POTASSIUM: 4.1 mmol/L (ref 3.5–5.1)
Sodium: 143 mmol/L (ref 135–145)

## 2014-07-10 LAB — GLUCOSE, CAPILLARY
GLUCOSE-CAPILLARY: 113 mg/dL — AB (ref 65–99)
Glucose-Capillary: 107 mg/dL — ABNORMAL HIGH (ref 65–99)
Glucose-Capillary: 106 mg/dL — ABNORMAL HIGH (ref 65–99)
Glucose-Capillary: 117 mg/dL — ABNORMAL HIGH (ref 65–99)

## 2014-07-10 LAB — CBC
HCT: 38.9 % (ref 35.0–47.0)
Hemoglobin: 12.8 g/dL (ref 12.0–16.0)
MCH: 29.9 pg (ref 26.0–34.0)
MCHC: 32.9 g/dL (ref 32.0–36.0)
MCV: 90.9 fL (ref 80.0–100.0)
PLATELETS: 100 10*3/uL — AB (ref 150–440)
RBC: 4.28 MIL/uL (ref 3.80–5.20)
RDW: 14 % (ref 11.5–14.5)
WBC: 4 10*3/uL (ref 3.6–11.0)

## 2014-07-10 NOTE — Consult Note (Signed)
  GI Inpatient Follow-up Note  Patient Identification: Melanie Wang is a 79 y.o. female with N/V/D.  Subjective: Remains symptomatic. Stool cx neg. On cipro. Able to locate previous reports. Had EGD in 11/06 that showed gastritis. 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 Hx of colon polyps in 7/02 but last colon in 11/06 only showed sigmoid tics. Has intermittent dysphagia.  Scheduled Inpatient Medications:  . aspirin EC  81 mg Oral Daily  . ciprofloxacin  250 mg Oral BID  . clonazePAM  1 mg Oral BID  . insulin aspart  0-9 Units Subcutaneous TID WC  . levothyroxine  100 mcg Oral QAC breakfast  . mometasone-formoterol  2 puff Inhalation BID  . pantoprazole  40 mg Oral BID  . potassium chloride  40 mEq Oral BID  . pravastatin  40 mg Oral Daily  . sertraline  150 mg Oral Daily  . tiotropium  18 mcg Inhalation Daily    Continuous Inpatient Infusions:   . 0.9 % NaCl with KCl 40 mEq / L 50 mL/hr (07/10/14 0344)    PRN Inpatient Medications:  acetaminophen, albuterol, alum & mag hydroxide-simeth, fluticasone, morphine injection, ondansetron **OR** ondansetron (ZOFRAN) IV, oxyCODONE-acetaminophen **AND** oxyCODONE, promethazine  Review of Systems: Constitutional: Weight is stable.  Eyes: No changes in vision. ENT: No oral lesions, sore throat.  GI: see HPI.  Heme/Lymph: No easy bruising.  CV: No chest pain.  GU: No hematuria.  Integumentary: No rashes.  Neuro: No headaches.  Psych: No depression/anxiety.  Endocrine: No heat/cold intolerance.  Allergic/Immunologic: No urticaria.  Resp: No cough, SOB.  Musculoskeletal: No joint swelling.    Physical Examination: BP 139/56 mmHg  Pulse 68  Temp(Src) 97.5 F (36.4 C) (Oral)  Resp 20  Ht 5\' 4"  (1.626 m)  Wt 80.65 kg (177 lb 12.8 oz)  BMI 30.50 kg/m2  SpO2 99% Gen: NAD, alert and oriented x 4 HEENT: PEERLA, EOMI, Neck: supple, no JVD or thyromegaly Chest: CTA  bilaterally, no wheezes, crackles, or other adventitious sounds CV: RRR, no m/g/c/r Abd: soft, NT, ND, +BS in all four quadrants; no HSM, guarding, ridigity, or rebound tenderness Ext: no edema, well perfused with 2+ pulses, Skin: no rash or lesions noted Lymph: no LAD  Data: Lab Results  Component Value Date   WBC 4.0 07/10/2014   HGB 12.8 07/10/2014   HCT 38.9 07/10/2014   MCV 90.9 07/10/2014   PLT 100* 07/10/2014    Recent Labs Lab 07/07/14 1919 07/08/14 0525 07/10/14 0559  HGB 13.3 13.0 12.8   Lab Results  Component Value Date   NA 143 07/10/2014   K 4.1 07/10/2014   CL 114* 07/10/2014   CO2 22 07/10/2014   BUN 30* 07/10/2014   CREATININE 1.20* 07/10/2014   Lab Results  Component Value Date   ALT 17 07/07/2014   AST 36 07/07/2014   ALKPHOS 50 07/07/2014   BILITOT 0.8 07/07/2014   No results for input(s): APTT, INR, PTT in the last 168 hours. Assessment/Plan: Melanie Wang is a 79 y.o. female with persistent N/V/D   Recommendations: Plan EGD with possible dilation tomorrow. thanks Please call with questions or concerns.  Adaysha Dubinsky, Ezzard StandingPAUL Y, MD

## 2014-07-10 NOTE — Plan of Care (Signed)
Problem: Discharge Progression Outcomes Goal: Discharge plan in place and appropriate Plan of care progress to goal for: diarrhea, nausea and vomiting No c/o nausea - Tylenol given for pain with improvement. - Diarrhea continues, awaiting on cultures. - Continues on PO potassium. Will continue to monitor.

## 2014-07-10 NOTE — Evaluation (Signed)
Physical Therapy Evaluation Patient Details Name: Melanie Wang MRN: [id number] DOB: 07/17/1935 Today's Date: 07/10/2014   History of Present Illness  79 yo female with onset of diarrhea and weakness was cleared for c diff and is ready for PT evaluation.  PMHx:  OSA, HLD, leukopenia, anemia, CPAP, a-fib, RVR, COPD, chronic renal failure.  Clinical Impression  Pt is going to be recommended to SNF as her gait on RW for short trips is limited and home has strenuous geography to negotiate.  Her plan is SNF to strengthen and increase her balance to transition to more independent gait and transfers.    Follow Up Recommendations SNF    Equipment Recommendations  None recommended by PT    Recommendations for Other Services       Precautions / Restrictions Precautions Precautions: Fall Restrictions Weight Bearing Restrictions: No      Mobility  Bed Mobility Overal bed mobility: Needs Assistance Bed Mobility: Supine to Sit     Supine to sit: Min guard;Min assist     General bed mobility comments: cues for sequence and safety with assistance under trunk  Transfers Overall transfer level: Needs assistance   Transfers: Sit to/from Stand;Stand Pivot Transfers Sit to Stand: Min guard;Min assist Stand pivot transfers: Min assist;Min guard       General transfer comment: cued hand placement and reminders for safety  Ambulation/Gait Ambulation/Gait assistance: Min assist;Min guard Ambulation Distance (Feet): 40 Feet Assistive device: Rolling walker (2 wheeled);1 person hand held assist Gait Pattern/deviations: Step-through pattern;Decreased step length - left;Decreased step length - right;Decreased dorsiflexion - left;Decreased dorsiflexion - right;Narrow base of support;Trunk flexed Gait velocity: reduced Gait velocity interpretation: Below normal speed for age/gender General Gait Details: reminders for setting limits and safety decision making  Stairs             Wheelchair Mobility    Modified Rankin (Stroke Patients Only)       Balance Overall balance assessment: Needs assistance Sitting-balance support: Feet supported Sitting balance-Leahy Scale: Fair   Postural control: Posterior lean Standing balance support: Bilateral upper extremity supported Standing balance-Leahy Scale: Poor                               Pertinent Vitals/Pain Pain Assessment: No/denies pain    Home Living Family/patient expects to be discharged to:: Private residence Living Arrangements: Alone Available Help at Discharge: Family;Available PRN/intermittently Type of Home: House Home Access: Stairs to enter Entrance Stairs-Rails: Left Entrance Stairs-Number of Steps: 7 Home Layout: One level Home Equipment: Walker - 4 wheels;Cane - single point;Shower seat      Prior Function Level of Independence: Independent with assistive device(s)         Comments: Pt has been home doing fairly well per her report but is extremely weak now     Hand Dominance        Extremity/Trunk Assessment   Upper Extremity Assessment: Overall WFL for tasks assessed           Lower Extremity Assessment: Generalized weakness      Cervical / Trunk Assessment: Normal  Communication   Communication: No difficulties  Cognition Arousal/Alertness: Awake/alert Behavior During Therapy: WFL for tasks assessed/performed Overall Cognitive Status: Within Functional Limits for tasks assessed                      General Comments General comments (skin integrity, edema, etc.): pt is lethargic initially but  then very awake and participatory, up in chair for lunch at end of session.    Exercises        Assessment/Plan    PT Assessment Patient needs continued PT services  PT Diagnosis Generalized weakness   PT Problem List Decreased strength;Decreased range of motion;Decreased balance;Decreased activity tolerance;Decreased mobility;Decreased  coordination;Decreased cognition;Decreased knowledge of use of DME;Decreased safety awareness;Decreased knowledge of precautions;Cardiopulmonary status limiting activity  PT Treatment Interventions DME instruction;Gait training;Stair training;Therapeutic exercise;Therapeutic activities;Functional mobility training;Balance training;Neuromuscular re-education;Patient/family education   PT Goals (Current goals can be found in the Care Plan section) Acute Rehab PT Goals Patient Stated Goal: To get home soon PT Goal Formulation: With patient Time For Goal Achievement: 07/24/14 Potential to Achieve Goals: Good    Frequency Min 2X/week   Barriers to discharge Other (comment) (Going to ALF) Needs 24/7 help    Co-evaluation               End of Session Equipment Utilized During Treatment: Gait belt Activity Tolerance: Patient tolerated treatment well;Patient limited by fatigue Patient left: in chair;with call bell/phone within reach;with chair alarm set Nurse Communication: Mobility status         Time: 1140-1220 PT Time Calculation (min) (ACUTE ONLY): 40 min   Charges:   PT Evaluation $Initial PT Evaluation Tier I: 1 Procedure PT Treatments $Gait Training: 8-22 mins $Therapeutic Activity: 8-22 mins   PT G Codes:        Ivar DrapeStout, Apurva Reily E 07/10/2014, 2:23 PM   Samul Dadauth Alyssa Rotondo, PT MS Acute Rehab Dept. Number: [facility] [id number] [phone] and [phone]

## 2014-07-10 NOTE — Plan of Care (Signed)
Problem: Discharge Progression Outcomes Goal: Discharge plan in place and appropriate Patient is a High Fall Risk[facility] From home alone Hx of HTN, GERD, COPD, DM, anemia. Continue with home meds. On high fall precautions per policy, offer toileting during hourly rounds. Goal: Other Discharge Outcomes/Goals Plan of care progress to goal for: diarrhea, nausea and vomiting - Zofran given for nausea with improvement. - Tylenol given for pain with improvement. - Diarrhea continues, awaiting on cultures. - Continues on PO potassium. Will continue to monitor.

## 2014-07-10 NOTE — Plan of Care (Signed)
Problem: Discharge Progression Outcomes Goal: Discharge plan in place and appropriate Individualization     

## 2014-07-10 NOTE — Clinical Social Work Note (Signed)
Clinical Social Worker spoke with [facility] to update them with the PT recommendations of SNF. [facility]. The are able to accept pt at discharge. CSW will continue to follow.   Dede QuerySarah Clayton Bosserman, MSW, LCSW Clinical Social Worker  [phone]

## 2014-07-10 NOTE — Progress Notes (Signed)
[facility] - [place] at [facility]   PATIENT NAME: Melanie Wang    MR#:  [id number]  DATE OF BIRTH:  05/13/1935  SUBJECTIVE:  Patient here with n/v and diarrhea no abdominal pain No blood in stools Still have vomiting and diarrhea. REVIEW OF SYSTEMS:    ROS Constitutional: negative for fever and positive for malaise/fatigue. Negative for chills.  Respiratory: Negative for cough.  Cardiovascular: Negative for chest pain.  Gastrointestinal: Positive for nausea, vomiting and diarrhea. Negative for abdominal pain.  Neurological: Negative for focal weakness and headaches.  Tolerating Diet: clear liquid diet, but still vomited.  DRUG ALLERGIES:   Allergies  Allergen Reactions  . Flagyl [Metronidazole] Hives  . Gabapentin Other (See Comments)    Reaction:  Unknown  . Glucophage [Metformin Hcl] Diarrhea  . Plavix [Clopidogrel] Other (See Comments)    Reaction:  Unknown  . Shellfish Allergy Other (See Comments)    Reaction:  Unknown  . Synthroid [Levothyroxine] Other (See Comments)    Reaction:  Unknown  . Naprosyn [Naproxen] Itching and Rash  . Sulfa Antibiotics Rash    VITALS:  Blood pressure 139/56, pulse 68, temperature 97.5 F (36.4 C), temperature source Oral, resp. rate 20, height 5\' 4"  (1.626 m), weight 80.65 kg (177 lb 12.8 oz), SpO2 99 %.  PHYSICAL EXAMINATION:   Physical Exam  Constitutional: She is well-developed, well-nourished, and in no distress. No distress.  HENT:  Head: Normocephalic and atraumatic.  Neck: Neck supple. No JVD present. No tracheal deviation present.  Cardiovascular: Normal rate, regular rhythm and normal heart sounds.   No murmur heard. Pulmonary/Chest: Effort normal and breath sounds normal. No respiratory distress. She has no wheezes.  Abdominal: Soft. She exhibits no distension. There is no tenderness. There is no rebound.  Musculoskeletal: Normal range of motion. She exhibits edema.  Neurological: She is  alert.  Skin: Skin is warm and dry. No erythema.  Psychiatric: Affect normal.    LABORATORY PANEL:   CBC  Recent Labs Lab 07/10/14 0559  WBC 4.0  HGB 12.8  HCT 38.9  PLT 100*   ------------------------------------------------------------------------------------------------------------------  Chemistries   Recent Labs Lab 07/07/14 1822  07/08/14 0525  07/10/14 0559  NA 139  --  140  < > 143  K 3.3*  --  2.8*  < > 4.1  CL 102  --  104  < > 114*  CO2 25  --  24  < > 22  GLUCOSE 119*  --  122*  < > 112*  BUN 37*  --  42*  < > 30*  CREATININE 2.00*  < > 1.74*  < > 1.20*  CALCIUM 8.7*  --  8.5*  < > 8.7*  MG  --   --  1.9  --   --   AST 36  --   --   --   --   ALT 17  --   --   --   --   ALKPHOS 50  --   --   --   --   BILITOT 0.8  --   --   --   --   < > = values in this interval not displayed. ------------------------------------------------------------------------------------------------------------------   ASSESSMENT AND PLAN:  79 y/o f here with n/v/and diarrhea.  1. Diarrhea:likely viral etiology or bacterial etiology. Her C. difficile is negative. so DC  vancomycin. started ciprofloxacin. Please follow up on stool cultures- still pending .   Still same diarrhea and vomit-  called GI consult.  plan is for endoscopy and likely esophageal dilatation tomorrow.  2. Hypokalemia: This is due to diarrhea and vomiting. Patient is receiving IV fluids with potassium. Normal now  3. Diabetes: Patient is on a clear liquid diet. For now I will continue with sliding scale insulin.  4. Hypertension, essential: Patient's blood pressure is stable. She is off of her blood pressure medications due to her nausea and vomiting. At this time since her blood pressures are within normal limits I will not restart the medications. I will continue to monitor blood pressure.  5. COPD: Patient does not appear to be in acute exacerbation at this time. We will continue with her inhalers.  6.  LOW PLATLETS: possibly from diarrhea and acute illness. HOLD HEPARIN and continue to follow CBC. Stable, no active bleed.   Management plans discussed with the patient and she is in agreement.  CODE STATUS: DNR  TOTAL TIME TAKING CARE OF THIS PATIENT: 30minutes.   POSSIBLE D/C IN 1-2 DAYS, DEPENDING ON CLINICAL CONDITION.   Altamese DillingVACHHANI, Monish Haliburton M.D on 07/10/2014 at 7:38 PM  Between 7am to 6pm - Pager - [phone] After 6pm go to [email or web address] - password [email or web address] [facility]  [facility]  Office  [phone]  CC: Primary care physician; No PCP Per Patient

## 2014-07-11 ENCOUNTER — Inpatient Hospital Stay: Payer: Medicare (Managed Care) | Admitting: Anesthesiology

## 2014-07-11 ENCOUNTER — Encounter: Payer: Self-pay | Admitting: Anesthesiology

## 2014-07-11 ENCOUNTER — Encounter
Admission: AD | Disposition: A | Discharge status: Skilled Nursing Facility | Payer: Self-pay | Source: Ambulatory Visit | Attending: Internal Medicine

## 2014-07-11 HISTORY — PX: ESOPHAGOGASTRODUODENOSCOPY: SHX5428

## 2014-07-11 LAB — GLUCOSE, CAPILLARY
GLUCOSE-CAPILLARY: 98 mg/dL (ref 65–99)
GLUCOSE-CAPILLARY: 92 mg/dL (ref 65–99)
Glucose-Capillary: 86 mg/dL (ref 65–99)
Glucose-Capillary: 89 mg/dL (ref 65–99)

## 2014-07-11 SURGERY — EGD (ESOPHAGOGASTRODUODENOSCOPY)
Anesthesia: General | Laterality: Left

## 2014-07-11 MED ORDER — BOOST / RESOURCE BREEZE PO LIQD
1.0000 | Freq: Three times a day (TID) | ORAL | Status: DC
  Administered 2014-07-11 – 2014-07-13 (×7): 1 via ORAL

## 2014-07-11 MED ORDER — SODIUM CHLORIDE 0.9 % IV SOLN
INTRAVENOUS | Status: DC
  Administered 2014-07-11 – 2014-07-14 (×6): via INTRAVENOUS

## 2014-07-11 MED ORDER — PROPOFOL 10 MG/ML IV BOLUS
INTRAVENOUS | Status: DC | PRN
  Administered 2014-07-11: 50 mg via INTRAVENOUS

## 2014-07-11 MED ORDER — CIPROFLOXACIN HCL 500 MG PO TABS
500.0000 mg | ORAL_TABLET | Freq: Two times a day (BID) | ORAL | Status: DC
  Administered 2014-07-11 – 2014-07-15 (×8): 500 mg via ORAL
  Filled 2014-07-11 (×8): qty 1

## 2014-07-11 MED ORDER — SODIUM CHLORIDE 0.9 % IV SOLN: INTRAVENOUS | Status: DC

## 2014-07-11 MED ORDER — LIDOCAINE HCL (CARDIAC) 20 MG/ML IV SOLN
INTRAVENOUS | Status: DC | PRN
  Administered 2014-07-11: 50 mg via INTRAVENOUS

## 2014-07-11 MED ORDER — MIDAZOLAM HCL 5 MG/5ML IJ SOLN
INTRAMUSCULAR | Status: DC | PRN
  Administered 2014-07-11: 1 mg via INTRAVENOUS

## 2014-07-11 MED ORDER — PROPOFOL INFUSION 10 MG/ML OPTIME
INTRAVENOUS | Status: DC | PRN
  Administered 2014-07-11: 100 ug/kg/min via INTRAVENOUS

## 2014-07-11 MED ORDER — SODIUM CHLORIDE 0.9 % IV SOLN
INTRAVENOUS | Status: DC
Start: 2014-07-11 — End: 2014-07-11
  Administered 2014-07-11: 12:00:00 1000 mL via INTRAVENOUS

## 2014-07-11 MED ORDER — FENTANYL CITRATE (PF) 100 MCG/2ML IJ SOLN
INTRAMUSCULAR | Status: DC | PRN
  Administered 2014-07-11: 50 ug via INTRAVENOUS

## 2014-07-11 NOTE — Op Note (Signed)
[facility] Gastroenterology Patient Name: Melanie Wang Procedure Date: 07/11/2014 12:03 PM MRN: [id number] Account #: [id number] Date of Birth: 11/09/1935 Admit Type: Inpatient Age: [age] [facility] 4 Gender: Female Note Status: Finalized Procedure:         Upper GI endoscopy Indications:       Dysphagia, Nausea with vomiting Providers:         Ezzard StandingPaul Y. Bluford Kaufmannh, MD Medicines:         Monitored Anesthesia Care Complications:     No immediate complications. Procedure:         Pre-Anesthesia Assessment:                    - Prior to the procedure, a History and Physical was                     performed, and patient medications, allergies and                     sensitivities were reviewed. The patient's tolerance of                     previous anesthesia was reviewed.                    - The risks and benefits of the procedure and the sedation                     options and risks were discussed with the patient. All                     questions were answered and informed consent was obtained.                    - After reviewing the risks and benefits, the patient was                     deemed in satisfactory condition to undergo the procedure.                    After obtaining informed consent, the endoscope was passed                     under direct vision. Throughout the procedure, the                     patient's blood pressure, pulse, and oxygen saturations                     were monitored continuously. The Olympus GIF-160 endoscope                     (S#. [id number]) was introduced through the mouth, and                     advanced to the second part of duodenum. The upper GI                     endoscopy was accomplished without difficulty. The patient                     tolerated the procedure well. Findings:      A benign-appearing, intrinsic mild stenosis was found at the       gastroesophageal junction. The scope was withdrawn. Dilation  was        performed with a Maloney dilator with mild resistance at 54 Fr.      The exam was otherwise without abnormality.      Diffuse moderate inflammation characterized by erythema and friability       was found in the gastric body. Biopsies were taken with a cold forceps       for Helicobacter pylori testing.      The exam was otherwise without abnormality. Impression:        - Benign-appearing esophageal stricture. Dilated.                    - The examination was otherwise normal.                    - Chronic gastritis. Biopsied.                    - The examination was otherwise normal. Recommendation:    - Observe patient's clinical course.                    - Continue present medications.                    - Await pathology results.                    - The findings and recommendations were discussed with the                     patient. Procedure Code(s): --- Professional ---                    [id number], Esophagogastroduodenoscopy, flexible, transoral;                     with biopsy, single or multiple                    43450, Dilation of esophagus, by unguided sound or bougie,                     single or multiple passes Diagnosis Code(s): --- Professional ---                    K22.2, Esophageal obstruction                    K29.50, Unspecified chronic gastritis without bleeding                    R13.10, Dysphagia, unspecified                    R11.2, Nausea with vomiting, unspecified CPT copyright 2014 American Medical Association. All rights reserved. The codes documented in this report are preliminary and upon coder review may  be revised to meet current compliance requirements. Wallace CullensPaul Y Konstantine Gervasi, MD 07/11/2014 12:24:01 PM This report has been signed electronically. Number of Addenda: 0 Note Initiated On: 07/11/2014 12:03 PM      [facility]

## 2014-07-11 NOTE — Anesthesia Postprocedure Evaluation (Signed)
  Anesthesia Post-op Note  Patient: Melanie Wang  Procedure(s) Performed: Procedure(s): Place tube in throat and evaluate esophagus and stomach for source of nausea. Dilate esophagus if stricture present. (Left)  Anesthesia type:General  Patient location: PACU  Post pain: Pain level controlled  Post assessment: Post-op Vital signs reviewed, Patient's Cardiovascular Status Stable, Respiratory Function Stable, Patent Airway and No signs of Nausea or vomiting  Post vital signs: Reviewed and stable  Last Vitals:  Filed Vitals:   07/11/14 1230  BP: 113/55  Pulse: 59  Temp: 35.8 C  Resp: 14    Level of consciousness: awake, alert  and patient cooperative  Complications: No apparent anesthesia complications

## 2014-07-11 NOTE — Consult Note (Signed)
  Nausea/vomiting/diarrhea stopped. EGD showed GE junction stricture which was dilated. Diffuse gastritis. Biopsies taken. Make sure pt stays on protonix bid. If nausea recurs, then can add carafate. If diarrhea recurs, then can order imodium prn since stool cx neg. I will check back on MOnday. If there are issues, call GI on call. Thanks.

## 2014-07-11 NOTE — Plan of Care (Signed)
Problem: Discharge Progression Outcomes Goal: Discharge plan in place and appropriate Individualization:  Pt prefers to be called Melanie Wang who is from home alone. Hx of HTN, GERD, COPD, DM, anemia which are controlled by home meds. Pt is a high fall risk requiring +1 assist to BSC. [facility] and pt shows understanding of how to use call system when necessary  Goal: Other Discharge Outcomes/Goals Plan of care progress to goals: 1. Pain: no c/o pain during the night  2. Hemodynamically: Vitals stable, IVF continue NS40K@50 , 1 occurrence of diarrhea but no N/V 3. Complications: none  4. Activity: pt able to move independently in bed but [facility]. PT working with pt during the day No fall or injury this shift. Will continue to assess.

## 2014-07-11 NOTE — Transfer of Care (Signed)
Immediate Anesthesia Transfer of Care Note  Patient: Melanie Wang  Procedure(s) Performed: Procedure(s): Place tube in throat and evaluate esophagus and stomach for source of nausea. Dilate esophagus if stricture present. (Left)  Patient Location: PACU and Endoscopy Unit  Anesthesia Type:General  Level of Consciousness: sedated and responds to stimulation  Airway & Oxygen Therapy: Patient Spontanous Breathing and Patient connected to face mask oxygen  Post-op Assessment: Report given to RN and Post -op Vital signs reviewed and stable  Post vital signs: Reviewed  Last Vitals:  Filed Vitals:   07/11/14 1230  BP: 113/55  Pulse: 59  Temp: 35.8 C  Resp: 14    Complications: No apparent anesthesia complications

## 2014-07-11 NOTE — Anesthesia Preprocedure Evaluation (Addendum)
Anesthesia Evaluation  Patient identified by MRN, date of birth, ID band Patient awake    Reviewed: Allergy & Precautions, NPO status , Patient's Chart, lab work & pertinent test results  Airway Mallampati: III  TM Distance: >3 FB Neck ROM: Full    Dental  (+) Upper Dentures, Lower Dentures   Pulmonary sleep apnea (occasional use) and Continuous Positive Airway Pressure Ventilation , COPD COPD inhaler, former smoker (quit 21 yrs),          Cardiovascular hypertension, Pt. on medications + Past MI and + CABG + dysrhythmias Atrial Fibrillation     Neuro/Psych Anxiety    GI/Hepatic GERD-  Medicated and Poorly Controlled,  Endo/Other    Renal/GU Renal InsufficiencyRenal disease     Musculoskeletal   Abdominal   Peds  Hematology  (+) anemia ,   Anesthesia Other Findings   Reproductive/Obstetrics                            Anesthesia Physical Anesthesia Plan  ASA: III  Anesthesia Plan: General   Post-op Pain Management:    Induction: Intravenous  Airway Management Planned: Nasal Cannula  Additional Equipment:   Intra-op Plan:   Post-operative Plan:   Informed Consent: I have reviewed the patients History and Physical, chart, labs and discussed the procedure including the risks, benefits and alternatives for the proposed anesthesia with the patient or authorized representative who has indicated his/her understanding and acceptance.     Plan Discussed with:   Anesthesia Plan Comments:         Anesthesia Quick Evaluation

## 2014-07-11 NOTE — Progress Notes (Signed)
[facility] [place] at [facility]   PATIENT NAME: Melanie Wang    MR#:  [id number]  DATE OF BIRTH:  09/17/1935  SUBJECTIVE:  Patient here with n/v and diarrhea no abdominal pain No blood in stools Still have vomiting and diarrhea. For procedure today. REVIEW OF SYSTEMS:    ROS Constitutional: negative for fever and positive for malaise/fatigue. Negative for chills.  Respiratory: Negative for cough.  Cardiovascular: Negative for chest pain.  Gastrointestinal: Positive for nausea, vomiting and diarrhea. Negative for abdominal pain.  Neurological: Negative for focal weakness and headaches.  Tolerating Diet: clear liquid diet, but still vomited.  DRUG ALLERGIES:   Allergies  Allergen Reactions  . Flagyl [Metronidazole] Hives  . Gabapentin Other (See Comments)    Reaction:  Unknown  . Glucophage [Metformin Hcl] Diarrhea  . Plavix [Clopidogrel] Other (See Comments)    Reaction:  Unknown  . Shellfish Allergy Other (See Comments)    Reaction:  Unknown  . Synthroid [Levothyroxine] Other (See Comments)    Reaction:  Unknown  . Naprosyn [Naproxen] Itching and Rash  . Sulfa Antibiotics Rash    VITALS:  Blood pressure 138/51, pulse 59, temperature 98.9 F (37.2 C), temperature source Oral, resp. rate 20, height 5\' 4"  (1.626 m), weight 80.65 kg (177 lb 12.8 oz), SpO2 98 %.  PHYSICAL EXAMINATION:   Physical Exam  Constitutional: She is well-developed, well-nourished, and in no distress. No distress.  HENT:  Head: Normocephalic and atraumatic.  Neck: Neck supple. No JVD present. No tracheal deviation present.  Cardiovascular: Normal rate, regular rhythm and normal heart sounds.   No murmur heard. Pulmonary/Chest: Effort normal and breath sounds normal. No respiratory distress. She has no wheezes.  Abdominal: Soft. She exhibits no distension. There is no tenderness. There is no rebound.  Musculoskeletal: Normal range of motion. She exhibits edema.   Neurological: She is alert.  Skin: Skin is warm and dry. No erythema.  Psychiatric: Affect normal.    LABORATORY PANEL:   CBC  Recent Labs Lab 07/10/14 0559  WBC 4.0  HGB 12.8  HCT 38.9  PLT 100*   ------------------------------------------------------------------------------------------------------------------  Chemistries   Recent Labs Lab 07/07/14 1822  07/08/14 0525  07/10/14 0559  NA 139  --  140  < > 143  K 3.3*  --  2.8*  < > 4.1  CL 102  --  104  < > 114*  CO2 25  --  24  < > 22  GLUCOSE 119*  --  122*  < > 112*  BUN 37*  --  42*  < > 30*  CREATININE 2.00*  < > 1.74*  < > 1.20*  CALCIUM 8.7*  --  8.5*  < > 8.7*  MG  --   --  1.9  --   --   AST 36  --   --   --   --   ALT 17  --   --   --   --   ALKPHOS 50  --   --   --   --   BILITOT 0.8  --   --   --   --   < > = values in this interval not displayed. ------------------------------------------------------------------------------------------------------------------   ASSESSMENT AND PLAN:  79 y/o f here with n/v/and diarrhea.  1. Diarrhea:likely viral etiology or bacterial etiology. Her C. difficile is negative. so DC  vancomycin. started ciprofloxacin. Please follow up on stool cultures- still pending .   Still same  diarrhea and vomit- called GI consult.  Esophageal dilation done,and gastritis- suggested PPI BID.  2. Hypokalemia: This is due to diarrhea and vomiting. Patient is receiving IV fluids with potassium. Normal now  3. Diabetes: Patient is on a clear liquid diet. For now I will continue with sliding scale insulin.  4. Hypertension, essential: Patient's blood pressure is stable. She is off of her blood pressure medications due to her nausea and vomiting. At this time since her blood pressures are within normal limits I will not restart the medications. I will continue to monitor blood pressure.  5. COPD: Patient does not appear to be in acute exacerbation at this time. We will continue with  her inhalers.  6. LOW PLATLETS: possibly from diarrhea and acute illness. HOLD HEPARIN and continue to follow CBC. Stable, no active bleed.   Management plans discussed with the patient and she is in agreement.  CODE STATUS: DNR  TOTAL TIME TAKING CARE OF THIS PATIENT: 30minutes.   POSSIBLE D/C IN 1-2 DAYS, DEPENDING ON CLINICAL CONDITION.   Tunya Held M.D on 07/11/2014 at Altamese Dilling3:20 PM  Between 7am to 6pm - Pager - [phone] After 6pm go to [email or web address] - password [email or web address] [facility]  [facility]  Office  [phone]  CC: Primary care physician; No PCP Per Patient

## 2014-07-11 NOTE — Progress Notes (Signed)
Initial Nutrition Assessment  DOCUMENTATION CODES:     INTERVENTION: Medical Food Supplement Therapy: will recommend Boost Breeze TID on meal trays  NUTRITION DIAGNOSIS:  Inadequate oral intake related to inability to eat as evidenced by NPO status  GOAL:   (Goal for diet advancement and tolerance as medically able)  MONITOR:   (Energy intake, Electrolyte and renal Profile, digestive system, Anthropometrics)  REASON FOR ASSESSMENT:   (RD Screen, Length of Stay)    ASSESSMENT:  Pt admitted with nausea and dysphagia, s/p EGD today with GE junction stricture, s/p dilatation, and gastritis per MD note. Pt asleep on visit, had returned from procedure. PMHx:  Past Medical History  Diagnosis Date  . Allergy   . Anemia   . Arthritis   . COPD (chronic obstructive pulmonary disease)   . Diabetes mellitus without complication   . GERD (gastroesophageal reflux disease)   . Hypertension     PO Intake: pt has been on FL since admission, average recorded po intake 44% of FL meals. Per MD note pt started to eat poorly this past Friday one week ago. Pt NPO this am for procedure, just advanced to CL. Per MST no decrease in appetite PTA.  Medications: NS at 3850mL/hr, novolog, KCl, Protonix Labs: Electrolyte and Renal Profile:    Recent Labs Lab 07/08/14 0525 07/09/14 0507 07/10/14 0559  BUN 42* 38* 30*  CREATININE 1.74* 1.51* 1.20*  NA 140 141 143  K 2.8* 3.0* 4.1  MG 1.9  --   --    Glucose Profile:  Recent Labs  07/10/14 2156 07/11/14 0731 07/11/14 1145  GLUCAP 117* 98 86   Protein Profile:  Recent Labs Lab 07/07/14 1822  ALBUMIN 3.9    Per MST no decrease in weight PTA.   Height:  Ht Readings from Last 1 Encounters:  07/08/14 5\' 4"  (1.626 m)    Weight:  Wt Readings from Last 1 Encounters:  07/08/14 177 lb 12.8 oz (80.65 kg)    Ideal Body Weight:     Wt Readings from Last 10 Encounters:  07/08/14 177 lb 12.8 oz (80.65 kg)     Unable to  complete Nutrition-Focused physical exam at this time.    BMI:  Body mass index is 30.5 kg/(m^2).  Estimated Nutritional Needs:  Kcal:  1683-1989kcals, BEE: 1275kcals, TEE: (IF 1.1-1.3)(AF 1.2)   Protein:  80-96g protein (1.0-1.2g/kg)  Fluid:  2000-247400mL of fluid (25-4730mL/kg)  Skin:  Reviewed, no issues  Diet Order:  Diet clear liquid Room service appropriate?: Yes; Fluid consistency:: Thin  EDUCATION NEEDS:  No education needs identified at this time   Intake/Output Summary (Last 24 hours) at 07/11/14 1607 Last data filed at 07/11/14 1300  Gross per 24 hour  Intake    400 ml  Output      0 ml  Net    400 ml    Last BM:  5/20 loose stool  MODERATE Care Level  Leda QuailAllyson Suzannah Bettes, RD, LDN Pager [phone]

## 2014-07-12 LAB — BASIC METABOLIC PANEL
ANION GAP: 5 (ref 5–15)
BUN: 14 mg/dL (ref 6–20)
CALCIUM: 8.4 mg/dL — AB (ref 8.9–10.3)
CO2: 22 mmol/L (ref 22–32)
CREATININE: 0.86 mg/dL (ref 0.44–1.00)
Chloride: 110 mmol/L (ref 101–111)
GFR calc Af Amer: >60 mL/min (ref >60)
GFR calc non Af Amer: >60 mL/min (ref >60)
Glucose, Bld: 102 mg/dL — ABNORMAL HIGH (ref 65–99)
POTASSIUM: 5.3 mmol/L — AB (ref 3.5–5.1)
Sodium: 137 mmol/L (ref 135–145)

## 2014-07-12 LAB — CBC
HCT: 36.1 % (ref 35.0–47.0)
HEMOGLOBIN: 12 g/dL (ref 12.0–16.0)
MCH: 29.9 pg (ref 26.0–34.0)
MCHC: 33.3 g/dL (ref 32.0–36.0)
MCV: 90 fL (ref 80.0–100.0)
Platelets: 110 10*3/uL — ABNORMAL LOW (ref 150–440)
RBC: 4.01 MIL/uL (ref 3.80–5.20)
RDW: 14 % (ref 11.5–14.5)
WBC: 5.5 10*3/uL (ref 3.6–11.0)

## 2014-07-12 LAB — GLUCOSE, CAPILLARY
GLUCOSE-CAPILLARY: 131 mg/dL — AB (ref 65–99)
Glucose-Capillary: 93 mg/dL (ref 65–99)
Glucose-Capillary: 114 mg/dL — ABNORMAL HIGH (ref 65–99)

## 2014-07-12 MED ORDER — SUCRALFATE 1 G PO TABS
1.0000 g | ORAL_TABLET | Freq: Three times a day (TID) | ORAL | Status: DC
  Administered 2014-07-12 – 2014-07-15 (×11): 1 g via ORAL
  Filled 2014-07-12 (×11): qty 1

## 2014-07-12 NOTE — Plan of Care (Signed)
Problem: Discharge Progression Outcomes Goal: Discharge plan in place and appropriate Individualization:  Outcome: Progressing Pt prefers to be called Aniesa who is from home alone. Hx: HTN, GERD, COPD, DM, anemia which are controlled by home meds. Pt is a high fall risk requiring +[facility]. Toileting offered on hourly rounds. Shows understanding of how to use call system.    Goal: Other Discharge Outcomes/Goals Outcome: Progressing Plan of care progress to goals:   1. Denies pain.      2. Hemodynamically: VSS. Remains on IVFs. Zofran given for nausea. Improvement noted.   3. No s/s of complications noted.       4. Remains on clear liquid diet. Increased to full liquids. Tolerating well.   5. Ambulates with walker and standby assistance.   6. No fall or injuries noted this shift.

## 2014-07-12 NOTE — Plan of Care (Signed)
Problem: Discharge Progression Outcomes Goal: Discharge plan in place and appropriate Individualization:  Pt prefers to be called Melanie Wang who is from home alone. Hx of HTN, GERD, COPD, DM, anemia which are controlled by home meds. Pt is a high fall risk requiring +[facility] to [facility]. Toileting offered on hourly rounds and pt shows understanding of how to use call system when necessary      Goal: Other Discharge Outcomes/Goals Plan of care progress to goals: 1. Pain: no c/o pain during the night   2. Hemodynamically: Vitals stable, IVF continue NS@50 , diarrhea continuesbut no N/V 3. Complications: none   4. Tolerating clear liquid diet well 5. Activity: pt able to move independently in bed but requires +[facility]. PT working with pt during the day No fall or injury this shift. Will continue to assess.

## 2014-07-12 NOTE — Progress Notes (Signed)
[facility] - [place] at [facility]   PATIENT NAME: Melanie Wang    MR#:  [id number]  DATE OF BIRTH:  03/18/1935  SUBJECTIVE:  Patient here with n/v and diarrhea no abdominal pain No blood in stools Still have vomiting and diarrhea. For procedure today. REVIEW OF SYSTEMS:    ROS Constitutional: negative for fever and positive for malaise/fatigue. Negative for chills.  Respiratory: Negative for cough.  Cardiovascular: Negative for chest pain.  Gastrointestinal: Positive for nausea, vomiting and diarrhea. Negative for abdominal pain.  Neurological: Negative for focal weakness and headaches.  Tolerating Diet: clear liquid diet, but still vomited.  DRUG ALLERGIES:   Allergies  Allergen Reactions  . Flagyl [Metronidazole] Hives  . Gabapentin Other (See Comments)    Reaction:  Unknown  . Glucophage [Metformin Hcl] Diarrhea  . Plavix [Clopidogrel] Other (See Comments)    Reaction:  Unknown  . Shellfish Allergy Other (See Comments)    Reaction:  Unknown  . Synthroid [Levothyroxine] Other (See Comments)    Reaction:  Unknown  . Naprosyn [Naproxen] Itching and Rash  . Sulfa Antibiotics Rash    VITALS:  Blood pressure 121/47, pulse 63, temperature 98.7 F (37.1 C), temperature source Oral, resp. rate 16, height 5\' 4"  (1.626 m), weight 80.65 kg (177 lb 12.8 oz), SpO2 96 %.  PHYSICAL EXAMINATION:   Physical Exam  Constitutional: She is well-developed, well-nourished, and in no distress. No distress.  HENT:  Head: Normocephalic and atraumatic.  Neck: Neck supple. No JVD present. No tracheal deviation present.  Cardiovascular: Normal rate, regular rhythm and normal heart sounds.   No murmur heard. Pulmonary/Chest: Effort normal and breath sounds normal. No respiratory distress. She has no wheezes.  Abdominal: Soft. She exhibits no distension. There is no tenderness. There is no rebound.  Musculoskeletal: Normal range of motion. She exhibits edema.   Neurological: She is alert.  Skin: Skin is warm and dry. No erythema.  Psychiatric: Affect normal.    LABORATORY PANEL:   CBC  Recent Labs Lab 07/12/14 0517  WBC 5.5  HGB 12.0  HCT 36.1  PLT 110*   ------------------------------------------------------------------------------------------------------------------  Chemistries   Recent Labs Lab 07/07/14 1822  07/08/14 0525  07/12/14 0517  NA 139  --  140  < > 137  K 3.3*  --  2.8*  < > 5.3*  CL 102  --  104  < > 110  CO2 25  --  24  < > 22  GLUCOSE 119*  --  122*  < > 102*  BUN 37*  --  42*  < > 14  CREATININE 2.00*  < > 1.74*  < > 0.86  CALCIUM 8.7*  --  8.5*  < > 8.4*  MG  --   --  1.9  --   --   AST 36  --   --   --   --   ALT 17  --   --   --   --   ALKPHOS 50  --   --   --   --   BILITOT 0.8  --   --   --   --   < > = values in this interval not displayed. ------------------------------------------------------------------------------------------------------------------   ASSESSMENT AND PLAN:  79 y/o f here with n/v/and diarrhea.  1. Diarrhea:likely viral etiology or bacterial etiology. Her C. difficile is negative. so DC  vancomycin. started ciprofloxacin. Please follow up on stool cultures- still pending .   Still same  diarrhea and vomit- called GI consult.  Esophageal dilation done,and gastritis- suggested PPI BID.   Continue to have nausea and vomit, diarrhea seems under control now- will add sucralfate.  2. Hypokalemia: This is due to diarrhea and vomiting. Patient is receiving IV fluids with potassium. Normal now  3. Diabetes: Patient is on a clear liquid diet. For now I will continue with sliding scale insulin.  4. Hypertension, essential: Patient's blood pressure is stable. She is off of her blood pressure medications due to her nausea and vomiting. At this time since her blood pressures are within normal limits I will not restart the medications. I will continue to monitor blood pressure.  5. COPD:  Patient does not appear to be in acute exacerbation at this time. We will continue with her inhalers.  6. LOW PLATLETS: possibly from diarrhea and acute illness. HOLD HEPARIN and continue to follow CBC. Stable, no active bleed.  NEED REHAB PLACEMENT.  Management plans discussed with the patient and she is in agreement.  CODE STATUS: DNR  TOTAL TIME TAKING CARE OF THIS PATIENT: .   POSSIBLE D/C IN 1-2 DAYS, DEPENDING ON CLINICAL CONDITION.   Altamese Dilling M.D on 07/12/2014 at 2:22 PM  Between 7am to 6pm - Pager - [phone] After 6pm go to [email or web address] - password [email or web address] [facility]  [place] [facility]  Office  [phone]  CC: Primary care physician; No PCP Per Patient

## 2014-07-13 LAB — GLUCOSE, CAPILLARY
Glucose-Capillary: 96 mg/dL (ref 65–99)
Glucose-Capillary: 121 mg/dL — ABNORMAL HIGH (ref 65–99)
Glucose-Capillary: 162 mg/dL — ABNORMAL HIGH (ref 65–99)
Glucose-Capillary: 101 mg/dL — ABNORMAL HIGH (ref 65–99)

## 2014-07-13 LAB — BASIC METABOLIC PANEL
Anion gap: 4 — ABNORMAL LOW (ref 5–15)
BUN: 13 mg/dL (ref 6–20)
CALCIUM: 8.8 mg/dL — AB (ref 8.9–10.3)
CO2: 27 mmol/L (ref 22–32)
CREATININE: 1.06 mg/dL — AB (ref 0.44–1.00)
Chloride: 107 mmol/L (ref 101–111)
GFR calc Af Amer: 58 mL/min — ABNORMAL LOW (ref >60)
GFR, EST NON AFRICAN AMERICAN: 50 mL/min — AB (ref >60)
Glucose, Bld: 107 mg/dL — ABNORMAL HIGH (ref 65–99)
Potassium: 5.9 mmol/L — ABNORMAL HIGH (ref 3.5–5.1)
Sodium: 138 mmol/L (ref 135–145)

## 2014-07-13 NOTE — Plan of Care (Signed)
Problem: Discharge Progression Outcomes Goal: Discharge plan in place and appropriate Individualization:  Pt prefers to be called Melanie Wang who is from home alone. Hx: HTN, GERD, COPD, DM, anemia which are controlled by home meds. Pt is a high fall risk [facility]. Toileting offered on hourly rounds. Shows understanding of how to use call system.     Goal: Other Discharge Outcomes/Goals Plan of care progress to goals:   1. Denies pain.       2. Hemodynamically: VSS. Remains on IVFs. Zofran given for nausea. Improvement noted.   3. No s/s of complications noted.        4. Remains on clear liquid diet. Increased to full liquids. Tolerating well.   5. Ambulates with walker and standby assistance.   6. No fall or injuries noted this shift.

## 2014-07-13 NOTE — Progress Notes (Signed)
Up to chair for dinner with ONE person assist. Bo McclintockBrewer,Bali Lyn S, RN

## 2014-07-13 NOTE — Progress Notes (Signed)
[facility] - [place] at [facility]   PATIENT NAME: Melanie Wang    MR#:  [id number]  DATE OF BIRTH:  [birth year]  SUBJECTIVE:   Admitted 5/16 with diarrhea.  Still with several large volume stools daily, moderate crampy abdominal pain, weakness. Reports nausea with any intake, no recent vomiting.  REVIEW OF SYSTEMS:    Review of Systems  Constitutional: Positive for malaise/fatigue. Negative for fever and chills.  Respiratory: Negative for shortness of breath.   Cardiovascular: Negative for chest pain and palpitations.  Gastrointestinal: Positive for nausea, abdominal pain and diarrhea. Negative for vomiting and blood in stool.  Genitourinary: Negative for dysuria.  Neurological: Positive for weakness.    DRUG ALLERGIES:   Allergies  Allergen Reactions  . Flagyl [Metronidazole] Hives  . Gabapentin Other (See Comments)    Reaction:  Unknown  . Glucophage [Metformin Hcl] Diarrhea  . Plavix [Clopidogrel] Other (See Comments)    Reaction:  Unknown  . Shellfish Allergy Other (See Comments)    Reaction:  Unknown  . Synthroid [Levothyroxine] Other (See Comments)    Reaction:  Unknown  . Naprosyn [Naproxen] Itching and Rash  . Sulfa Antibiotics Rash    VITALS:  Blood pressure 112/48, pulse 62, temperature 98.5 F (36.9 C), temperature source Oral, resp. rate 18, height  (1.626 m), weight 80.65 kg (177 lb 12.8 oz), SpO2 98 %.  PHYSICAL EXAMINATION:   Physical Exam  Constitutional: She is well-developed, well-nourished, and in no distress. No distress. obese HENT: Head: Normocephalic and atraumatic. Edentulous. Neck: Neck supple. No JVD present. No tracheal deviation present.  Cardiovascular: Normal rate, regular rhythm and normal heart sounds.  No mrg Pulmonary/Chest: Effort normal and breath sounds normal. No respiratory distress. She has no wheezes.  Abdominal: Soft. Slightly distended, BS +. There is no tenderness. There is no rebound.   Musculoskeletal: Normal range of motion. No edema Neurological: She is alert and oriented Skin: Skin is warm and dry. No erythema.  Psychiatric: Affect normal.    LABORATORY PANEL:   CBC  Recent Labs Lab 07/12/14 0517  WBC 5.5  HGB 12.0  HCT 36.1  PLT 110*   ------------------------------------------------------------------------------------------------------------------  Chemistries   Recent Labs Lab 07/07/14 1822  07/08/14 0525  07/12/14 0517  NA 139  --  140  < > 137  K 3.3*  --  2.8*  < > 5.3*  CL 102  --  104  < > 110  CO2 25  --  24  < > 22  GLUCOSE 119*  --  122*  < > 102*  BUN 37*  --  42*  < > 14  CREATININE 2.00*  < > 1.74*  < > 0.86  CALCIUM 8.7*  --  8.5*  < > 8.4*  MG  --   --  1.9  --   --   AST 36  --   --   --   --   ALT 17  --   --   --   --   ALKPHOS 50  --   --   --   --   BILITOT 0.8  --   --   --   --   < > = values in this interval not displayed. ------------------------------------------------------------------------------------------------------------------   ASSESSMENT AND PLAN:  79 y/o f here with n/v/and diarrhea admitted 5/19  1. Diarrhea: improving - stool cultures + for salmonella, continue cipro, stop immodium  2. Gastritis - EGD by Dr Bluford Kaufmann on  5/20 - protonix BID and carafate - pathology pending - advance to soft diet  3. Hypokalemia:  - last K elevated, recheck today  4. Diabetes:  - cbg's controlled, continue SSI  5. Hypertension, essential:  - BP low normal, no antihypertensives.  - continue gentle hydration  6. COPD: Patient does not appear to be in acute exacerbation at this time. We will continue with her inhalers.  6. LOW PLATLETS: Stable. possibly from diarrhea and acute illness. HOLD HEPARIN and continue to follow CBC. Stable, no active bleed.  NEED REHAB PLACEMENT. This is a [facility] patient.  Management plans discussed with the patient and she is in agreement.  CODE STATUS: DNR  TOTAL TIME TAKING CARE OF  THIS PATIENT: 30 minutes.   POSSIBLE D/C IN 1-2 DAYS, DEPENDING ON CLINICAL CONDITION.   Elby ShowersWALSH, CATHERINE M.D on 07/13/2014 at 8:36 AM  Between 7am to 6pm - Pager - [phone] After 6pm go to [email or web address] - password [email or web address] [facility]  [place] [facility] [facility]  Office  [phone]  CC: Primary care physician; No PCP Per Patient

## 2014-07-13 NOTE — Plan of Care (Signed)
Problem: Discharge Progression Outcomes Goal: Discharge plan in place and appropriate Individualization:  Individualization of Care:   Patient prefers to be called Melanie Wang; from home alone.  PMH: HTN, GERD, COPD, DM and anemia - controlled by home medications.  High Fall Risk; [facility]. Patient demonstrates understanding of how to call for assistance.     Goal: Other Discharge Outcomes/Goals Plan of care progress to goal:  No complaints of pain. NS at 50 ml/hr infusing. No complaints of nausea/vomiting. Patient advanced to soft diet. Ambulates with ONE person assist [facility] with walker. High fall risk - bed alarm on, room near nurses station.

## 2014-07-14 LAB — BASIC METABOLIC PANEL
Anion gap: 9 (ref 5–15)
BUN: 11 mg/dL (ref 6–20)
CO2: 26 mmol/L (ref 22–32)
Calcium: 8.2 mg/dL — ABNORMAL LOW (ref 8.9–10.3)
Chloride: 104 mmol/L (ref 101–111)
Creatinine, Ser: 0.97 mg/dL (ref 0.44–1.00)
GFR calc non Af Amer: 55 mL/min — ABNORMAL LOW (ref >60)
Glucose, Bld: 116 mg/dL — ABNORMAL HIGH (ref 65–99)
POTASSIUM: 4 mmol/L (ref 3.5–5.1)
Sodium: 139 mmol/L (ref 135–145)

## 2014-07-14 LAB — GLUCOSE, CAPILLARY
GLUCOSE-CAPILLARY: 131 mg/dL — AB (ref 65–99)
Glucose-Capillary: 110 mg/dL — ABNORMAL HIGH (ref 65–99)
Glucose-Capillary: 114 mg/dL — ABNORMAL HIGH (ref 65–99)
Glucose-Capillary: 126 mg/dL — ABNORMAL HIGH (ref 65–99)

## 2014-07-14 MED ORDER — SODIUM POLYSTYRENE SULFONATE 15 GM/60ML PO SUSP
15.0000 g | ORAL | Status: AC
  Administered 2014-07-14: 10:00:00 15 g via ORAL
  Filled 2014-07-14: qty 60

## 2014-07-14 NOTE — Plan of Care (Signed)
Problem: Discharge Progression Outcomes Goal: Discharge plan in place and appropriate Individualization:  Pt prefers to be called Melanie Wang who is from home alone. Hx: HTN, GERD, COPD, DM, anemia which are controlled by home meds. Pt is a high fall risk [facility]. Toileting offered on hourly rounds. Shows understanding of how to use call system.         Goal: Other Discharge Outcomes/Goals Plan of care progress to goals: 1. R arm pain r/t IV infiltration relieved by PRN pain medication and ice.     2. Hemodynamically:              -VSS             -IVFs continued              -no n/v or diarrhea during the night       4. Tolerating soft food diet well  5. Ambulates with walker and standby assistance. No fall or injuries this shift. Will continue to assess.

## 2014-07-14 NOTE — Progress Notes (Signed)
[facility] [place] [facility]   PATIENT NAME: Melanie Wang    MR#:  [id number]  DATE OF BIRTH:  03/07/1935  SUBJECTIVE:   Admitted 5/16 with diarrhea stool is positive for salmonella. She reports that she has had 3 very small bowel movements in the past 24 hours. She continues to have nausea and some vomiting earlier today.  REVIEW OF SYSTEMS:    Review of Systems  Constitutional: Negative for fever.  Respiratory: Negative for shortness of breath.   Cardiovascular: Negative for chest pain and palpitations.  Gastrointestinal: Positive for nausea and vomiting. Negative for abdominal pain, diarrhea and blood in stool.  Genitourinary: Negative for dysuria.    DRUG ALLERGIES:   Allergies  Allergen Reactions  . Flagyl [Metronidazole] Hives  . Gabapentin Other (See Comments)    Reaction:  Unknown  . Glucophage [Metformin Hcl] Diarrhea  . Plavix [Clopidogrel] Other (See Comments)    Reaction:  Unknown  . Shellfish Allergy Other (See Comments)    Reaction:  Unknown  . Synthroid [Levothyroxine] Other (See Comments)    Reaction:  Unknown  . Naprosyn [Naproxen] Itching and Rash  . Sulfa Antibiotics Rash    VITALS:  Blood pressure 125/54, pulse 63, temperature 98.7 F (37.1 C), temperature source Oral, resp. rate 18, height 5\' 4"  (1.626 m), weight 80.65 kg (177 lb 12.8 oz), SpO2 96 %.  PHYSICAL EXAMINATION:   Physical Exam  Constitutional: She is well-developed, well-nourished, and in no distress. No distress. obese. Eating lunch HENT: Head: Normocephalic and atraumatic. Edentulous. Neck: Neck supple. No JVD present. No tracheal deviation present.  Cardiovascular: Normal rate, regular rhythm and normal heart sounds.  No mrg Pulmonary/Chest: Effort normal and breath sounds normal. No respiratory distress. She has no wheezes.  Abdominal: Soft. Slightly distended, BS +. There is no tenderness. There is no rebound.  Musculoskeletal: Normal Wang of  motion. No edema Neurological: She is alert and oriented Skin: Skin is warm and dry. No erythema.  Psychiatric: Affect normal.    LABORATORY PANEL:   CBC  Recent Labs Lab 07/12/14 0517  WBC 5.5  HGB 12.0  HCT 36.1  PLT 110*   ------------------------------------------------------------------------------------------------------------------  Chemistries   Recent Labs Lab 07/07/14 1822  07/08/14 0525  07/13/14 0820  NA 139  --  140  < > 138  K 3.3*  --  2.8*  < > 5.9*  CL 102  --  104  < > 107  CO2 25  --  24  < > 27  GLUCOSE 119*  --  122*  < > 107*  BUN 37*  --  42*  < > 13  CREATININE 2.00*  < > 1.74*  < > 1.06*  CALCIUM 8.7*  --  8.5*  < > 8.8*  MG  --   --  1.9  --   --   AST 36  --   --   --   --   ALT 17  --   --   --   --   ALKPHOS 50  --   --   --   --   BILITOT 0.8  --   --   --   --   < > = values in this interval not displayed. ------------------------------------------------------------------------------------------------------------------   ASSESSMENT AND PLAN:  79 y/o f here with n/v/and diarrhea admitted 5/19  1. Diarrhea: improving - stool cultures + for salmonella, continue cipro, stop immodium  2. Gastritis - EGD by Dr  Oh on 5/20 - protonix BID and carafate - pathology pending - advance to soft diet  3. Hypokalemia:  - Potassium elevated again this morning. One dose Kayexalate given. Will recheck this afternoon.  4. Diabetes:  - cbg's controlled, continue SSI  5. Hypertension, essential:  - BP low normal, no antihypertensives.  - continue gentle hydration  6. COPD: Patient does not appear to be in acute exacerbation at this time. We will continue with her inhalers.  7. LOW PLATLETS: Stable. possibly from diarrhea and acute illness. HOLD HEPARIN and continue to follow CBC. Stable, no active bleed.  #8 acute kidney injury: She had been improving significantly, but seems to be dehydrated again. Restart IV hydration  NEED REHAB  PLACEMENT. This is a [facility]ACE patient.  Management plans discussed with the patient and she is in agreement.  CODE STATUS: DNR  TOTAL TIME TAKING CARE OF THIS PATIENT: 30 minutes.   POSSIBLE D/C IN 1-2 DAYS, DEPENDING ON CLINICAL CONDITION.   Elby Showers M.D on 07/14/2014 at 1:34 PM  Between 7am to 6pm - Pager - [phone] After 6pm go to [email or web address] - password [email or web address] [facility]  [place] [facility]  Office  [phone]  CC: Primary care physician; No PCP Per Patient

## 2014-07-14 NOTE — Plan of Care (Signed)
Problem: Discharge Progression Outcomes Goal: Discharge plan in place and appropriate Individualization:  Outcome: Progressing Pt prefers to be called Melanie Wang who is from home alone. Hx: HTN, GERD, COPD, DM, anemia which are controlled by home meds. Pt is a high fall risk requiring +[facility]. Toileting offered on hourly rounds. Shows understanding of how to use call system.  Goal: Other Discharge Outcomes/Goals Outcome: Progressing Plan of care progress to goals: 1. Hemodynamically:               -VSS             -IVFs continued               -no n/v or diarrhea during the night        2. Tolerating soft food diet well   3. Ambulates with walker and standby assistance. Up in chair with assistance. 4. Plan for HHPT, pt of PACE. No fall or injuries this shift.

## 2014-07-14 NOTE — Clinical Social Work Note (Signed)
Pt will likely be discharged tomorrow, per MD. CSW updated PACE RN. CSW will continue to follow.  Dede QuerySarah Shaton Lore, MSW, LCSW Clinical Social Worker [phone]

## 2014-07-14 NOTE — Progress Notes (Signed)
PT Cancellation Note  Patient Details Name: Melanie Wang MRN: [id number] DOB: 02/18/1936   Cancelled Treatment:    Reason Eval/Treat Not Completed: Medical issues which prohibited therapy.  Vomiting despite her anti nausea meds.  Will retry later if time allows.   Ivar DrapeStout, Delfino Friesen E 07/14/2014, 11:04 AM   Samul Dadauth Posey Petrik, PT MS Acute Rehab Dept. Number: [facility] [phone] and [facility] [phone]

## 2014-07-14 NOTE — Progress Notes (Signed)
Physical Therapy Treatment Patient Details Name: Melanie Wang M Soderlund MRN: [id number] DOB: 07/03/1935 Today's Date: 07/14/2014    History of Present Illness 79 yo female with onset of diarrhea and weakness was cleared for c diff and is ready for PT evaluation.  PMHx:  OSA, HLD, leukopenia, anemia, CPAP, a-fib, RVR, COPD, chronic renal failure.    PT Comments    Pt is able to walk with lesser assistance today but her endurance is still low.  Her plan is to come home per her report but cannot safely walk alone.  Will need continual care which is best done in SNF.  Plan to work on balance and there ex next visit to instruct HEP.  Follow Up Recommendations  SNF     Equipment Recommendations  None recommended by PT    Recommendations for Other Services       Precautions / Restrictions Precautions Precautions: Fall Restrictions Weight Bearing Restrictions: No    Mobility  Bed Mobility Overal bed mobility: Needs Assistance Bed Mobility: Supine to Sit;Sit to Supine     Supine to sit: Min guard;Min assist Sit to supine: Min guard;Min assist   General bed mobility comments: cues for sequence and safety with assistance under trunk  Transfers Overall transfer level: Needs assistance Equipment used: Rolling walker (2 wheeled);1 person hand held assist Transfers: Sit to/from [employer]Stand;Stand Pivot Transfers Sit to Stand: Min guard Stand pivot transfers: Min guard       General transfer comment: cued hand placement and reminders for safety  Ambulation/Gait Ambulation/Gait assistance: Min guard Ambulation Distance (Feet): 125 Feet Assistive device: Rolling walker (2 wheeled);1 person hand held assist Gait Pattern/deviations: Step-through pattern;Decreased stride length;Trunk flexed;Wide base of support (pushes walker too far forward and needed reminding to pull i) Gait velocity: reduced Gait velocity interpretation: Below normal speed for age/gender General Gait Details: reminders for  setting limits and safety decision making   Stairs            Wheelchair Mobility    Modified Rankin (Stroke Patients Only)       Balance Overall balance assessment: Needs assistance   Sitting balance-Leahy Scale: Good     Standing balance support: Bilateral upper extremity supported Standing balance-Leahy Scale: Fair Standing balance comment: controlled static balance                     Cognition Arousal/Alertness: Awake/alert Behavior During Therapy: WFL for tasks assessed/performed Overall Cognitive Status: Within Functional Limits for tasks assessed                      Exercises      General Comments General comments (skin integrity, edema, etc.): sick this AM and then better with meds, able to tolerate gait and trip to regular BR      Pertinent Vitals/Pain Pain Assessment: No/denies pain    Home Living                      Prior Function            PT Goals (current goals can now be found in the care plan section) Acute Rehab PT Goals Patient Stated Goal: To get home soon Progress towards PT goals: Progressing toward goals    Frequency  Min 2X/week    PT Plan Current plan remains appropriate    Co-evaluation             End of Session Equipment Utilized During Treatment: Gait  belt Activity Tolerance: Patient tolerated treatment well;Patient limited by fatigue Patient left: in bed;with call bell/phone within reach;with bed alarm set     Time: 1510-1540 PT Time Calculation (min) (ACUTE ONLY): 30 min  Charges:  $Gait Training: 8-22 mins $Therapeutic Activity: 8-22 mins                    G Codes:      Ivar Drape 25-Jul-2014, 3:53 PM   Samul Dada, PT MS Acute Rehab Dept. Number: [facility] [id number] and [facility] [phone]

## 2014-07-14 NOTE — Consult Note (Signed)
  GI Inpatient Follow-up Note  Patient Identification: Melanie Wang is a 79 y.o. female  Subjective: Still does not feel well but nausea and diarrhea improved. Stool positve for salmonella now. On cipro.  Scheduled Inpatient Medications:  . aspirin EC  81 mg Oral Daily  . ciprofloxacin  500 mg Oral BID  . clonazePAM  1 mg Oral BID  . insulin aspart  0-9 Units Subcutaneous TID WC  . levothyroxine  100 mcg Oral QAC breakfast  . mometasone-formoterol  2 puff Inhalation BID  . pantoprazole  40 mg Oral BID  . pravastatin  40 mg Oral Daily  . sertraline  150 mg Oral Daily  . sucralfate  1 g Oral TID WC & HS  . tiotropium  18 mcg Inhalation Daily    Continuous Inpatient Infusions:   . sodium chloride 50 mL/hr at 07/13/14 1929    PRN Inpatient Medications:  acetaminophen, albuterol, alum & mag hydroxide-simeth, fluticasone, morphine injection, ondansetron **OR** ondansetron (ZOFRAN) IV, oxyCODONE-acetaminophen **AND** oxyCODONE, promethazine  Review of Systems: Constitutional: Weight is stable.  Eyes: No changes in vision. ENT: No oral lesions, sore throat.  GI: see HPI.  Heme/Lymph: No easy bruising.  CV: No chest pain.  GU: No hematuria.  Integumentary: No rashes.  Neuro: No headaches.  Psych: No depression/anxiety.  Endocrine: No heat/cold intolerance.  Allergic/Immunologic: No urticaria.  Resp: No cough, SOB.  Musculoskeletal: No joint swelling.    Physical Examination: BP 125/54 mmHg  Pulse 63  Temp(Src) 98.7 F (37.1 C) (Oral)  Resp 18  Ht 5\' 4"  (1.626 m)  Wt 80.65 kg (177 lb 12.8 oz)  BMI 30.50 kg/m2  SpO2 96% Gen: NAD, alert and oriented x 4 HEENT: PEERLA, EOMI, Neck: supple, no JVD or thyromegaly Chest: CTA bilaterally, no wheezes, crackles, or other adventitious sounds CV: RRR, no m/g/c/r Abd: soft, NT, ND, +BS in all four quadrants; no HSM, guarding, ridigity, or rebound tenderness Ext: no edema, well perfused with 2+ pulses, Skin: no rash or lesions  noted Lymph: no LAD  Data: Lab Results  Component Value Date   WBC 5.5 07/12/2014   HGB 12.0 07/12/2014   HCT 36.1 07/12/2014   MCV 90.0 07/12/2014   PLT 110* 07/12/2014    Recent Labs Lab 07/08/14 0525 07/10/14 0559 07/12/14 0517  HGB 13.0 12.8 12.0   Lab Results  Component Value Date   NA 138 07/13/2014   K 5.9* 07/13/2014   CL 107 07/13/2014   CO2 27 07/13/2014   BUN 13 07/13/2014   CREATININE 1.06* 07/13/2014   Lab Results  Component Value Date   ALT 17 07/07/2014   AST 36 07/07/2014   ALKPHOS 50 07/07/2014   BILITOT 0.8 07/07/2014   No results for input(s): APTT, INR, PTT in the last 168 hours. Assessment/Plan: Ms. Melanie Wang is a 79 y.o. female with salmonella. Also, has gastritis. On protonix bid and cipro bid.   Recommendations: Continue protonix bid. EGD still pending. Agree with stopping imodium. Continue cipro. If diarrhea worsens again despite cipro, have ID recommend appropriate Abx. Will sign off. Pt can f/u in GI later as outpt. thanks Please call with questions or concerns.  Adamary Savary, Ezzard StandingPAUL Y, MD

## 2014-07-15 ENCOUNTER — Encounter: Payer: Self-pay | Admitting: Gastroenterology

## 2014-07-15 DIAGNOSIS — A020 Salmonella enteritis: Secondary | ICD-10-CM | POA: Diagnosis present

## 2014-07-15 LAB — GLUCOSE, CAPILLARY
Glucose-Capillary: 117 mg/dL — ABNORMAL HIGH (ref 65–99)
Glucose-Capillary: 151 mg/dL — ABNORMAL HIGH (ref 65–99)

## 2014-07-15 MED ORDER — CIPROFLOXACIN HCL 500 MG PO TABS: 500.0000 mg | ORAL_TABLET | Freq: Two times a day (BID) | ORAL | Status: AC

## 2014-07-15 MED ORDER — SUCRALFATE 1 G PO TABS: 1.0000 g | ORAL_TABLET | Freq: Three times a day (TID) | ORAL | Status: AC

## 2014-07-15 NOTE — Plan of Care (Addendum)
Problem: Discharge Progression Outcomes Goal: Discharge plan in place and appropriate Individualization:  Outcome: Adequate for Discharge Pt prefers to be called [name] who is from home alone. Hx: HTN, GERD, COPD, DM, anemia which are controlled by home meds. Pt is a high fall risk [facility] Toileting offered on hourly rounds. Shows understanding of how to use call system.   Goal: Other Discharge Outcomes/Goals Outcome: Adequate for Discharge Plan of care progress to goals: 1. Hemodynamically:                 -VSS             -IVFs d/c'd                 -no n/v or diarrhea during the night          2. Tolerating soft food diet well     3. Ambulates with walker and standby assistance. Up in chair with assistance.  Plan for d/c to SNF today, patient of [facility]  [facility].   Patient left via wheelchair with [facility] staff. Discharge packet left with patient.

## 2014-07-15 NOTE — Discharge Summary (Signed)
[facility] [place] at [facility]  DISCHARGE SUMMARY   PATIENT NAME: Melanie Wang    MR#:  [id number]  DATE OF BIRTH:  11-14-1935  DATE OF ADMISSION:  07/07/2014 ADMITTING PHYSICIAN: Katharina Caper, MD  DATE OF DISCHARGE: No discharge date for patient encounter.  PRIMARY CARE PHYSICIAN: [facility], Lawrence Santiago NP   ADMISSION DIAGNOSIS:  Dehydration Gastroenteritis  nausea, dysphagia  DISCHARGE DIAGNOSIS:  Principal Problem:   Salmonella dysentery Active Problems:   Dehydration   SECONDARY DIAGNOSIS:   Past Medical History  Diagnosis Date  . Allergy   . Anemia   . Arthritis   . COPD (chronic obstructive pulmonary disease)   . Diabetes mellitus without complication   . GERD (gastroesophageal reflux disease)   . Hypertension     HOSPITAL COURSE:   79 y/o f here with n/v/and diarrhea admitted 5/19  1. Diarrhea: Presented with volume depletion due to multiple large volume stools. The time of discharge she has not had a bowel movement in greater than 12 hours. Last bowel movements have been small. Stool cultures + for salmonella, continue cipro for a total of 14 day course.  2. Gastritis - EGD by Dr Bluford Kaufmann on 5/20 showed gastritis. She is now on Protonix twice a day and Carafate. Pathology from EGD still pending. She has advanced her diet and is doing well with regular diet no further nausea or vomiting  3. Hypokalemia: Will need to monitor potassium. She is on Lasix as an outpatient. She had been on potassium supplementation as well but was hyperkalemic during this hospitalization. She should have a [place] checked at least once a week.  4. Diabetes: Hemoglobin A1c was not checked during this admission. She was on sliding scale insulin and had minimal additional insulin requirement. She is not on any outpatient hypoglycemic agents, and this should be fine going forward.  5. Hypertension, essential: Blood pressure low normal during the  hospitalization due to volume depletion. At the time of discharge she can resume when necessary furosemide for swelling. Otherwise she is not on any antihypertensives as an outpatient and this should be fine going forward  6. COPD: No exacerbation during the hospitalization. No need for supplemental oxygen. Continue with home inhalers.  7. Thrombocytopenia: Mild. Likely due to acute illness. Heparin was held during the hospitalization. Signs of bleeding.   #8 acute kidney injury: Due to volume depletion. Her baseline creatinine seems to be about 1.0. She is at baseline at the time of discharge. Continue to monitor especially if when necessary furosemide is administered  #9 deconditioning: Patient was followed by physical therapy throughout the hospitalization. Skilled nursing facility has been recommended for strength and balance.  DISCHARGE CONDITIONS:   Fair  CONSULTS OBTAINED:  Treatment Team:  Gale Journey, MD  Dr. Lutricia Feil, gastroenterology  DRUG ALLERGIES:   Allergies  Allergen Reactions  . Flagyl [Metronidazole] Hives  . Gabapentin Other (See Comments)    Reaction:  Unknown  . Glucophage [Metformin Hcl] Diarrhea  . Plavix [Clopidogrel] Other (See Comments)    Reaction:  Unknown  . Shellfish Allergy Other (See Comments)    Reaction:  Unknown  . Synthroid [Levothyroxine] Other (See Comments)    Reaction:  Unknown  . Naprosyn [Naproxen] Itching and Rash  . Sulfa Antibiotics Rash    DISCHARGE MEDICATIONS:   Current Discharge Medication List    START taking these medications   Details  ciprofloxacin (CIPRO) 500 MG tablet Take 1 tablet (500 mg total) by  mouth 2 (two) times daily. Qty: 20 tablet, Refills: 0    sucralfate (CARAFATE) 1 G tablet Take 1 tablet (1 g total) by mouth 4 (four) times daily -  with meals and at bedtime. Qty: 21 tablet, Refills: 0      CONTINUE these medications which have NOT CHANGED   Details  acetaminophen (TYLENOL) 650 MG CR tablet  Take 650 mg by mouth every 12 (twelve) hours as needed for pain.    albuterol (PROVENTIL HFA;VENTOLIN HFA) 108 (90 BASE) MCG/ACT inhaler Inhale 2 puffs into the lungs 4 (four) times daily as needed for shortness of breath.    albuterol (PROVENTIL) (2.5 MG/3ML) 0.083% nebulizer solution Take 2.5 mg by nebulization every 4 (four) hours as needed for wheezing or shortness of breath (for cough).    aluminum-magnesium hydroxide-simethicone (MAALOX) 200-200-20 MG/5ML SUSP Take 30 mLs by mouth every 6 (six) hours as needed (for heartburn).    aspirin EC 81 MG tablet Take 81 mg by mouth daily.    clonazePAM (KLONOPIN) 1 MG tablet Take 1 mg by mouth 2 (two) times daily.    dicyclomine (BENTYL) 10 MG capsule Take 10 mg by mouth 2 (two) times daily as needed for spasms.    ergocalciferol (VITAMIN D2) 50000 UNITS capsule Take 50,000 Units by mouth every 30 (thirty) days. Pt takes on the first Monday of each month.    fluticasone (FLONASE) 50 MCG/ACT nasal spray Place 1 spray into both nostrils daily as needed for allergies.    fluticasone-salmeterol (ADVAIR HFA) 230-21 MCG/ACT inhaler Inhale 2 puffs into the lungs every 12 (twelve) hours.    furosemide (LASIX) 20 MG tablet Take 20 mg by mouth daily as needed (for weight gain greater than 2lbs in one day or 5lbs in one week).    guaiFENesin-dextromethorphan (ROBITUSSIN DM) 100-10 MG/5ML syrup Take 10 mLs by mouth every 4 (four) hours as needed for cough.    HYDROcodone-acetaminophen (NORCO/VICODIN) 5-325 MG per tablet Take 1 tablet by mouth 3 (three) times daily as needed for moderate pain.    hydrocortisone (ANUSOL-HC) 25 MG suppository Place 25 mg rectally at bedtime as needed for hemorrhoids.    ipratropium-albuterol (DUONEB) 0.5-2.5 (3) MG/3ML SOLN Take 3 mLs by nebulization every 4 (four) hours as needed (for shortness of breath).    levothyroxine (SYNTHROID, LEVOTHROID) 100 MCG tablet Take 100 mcg by mouth daily.    lidocaine (LIDODERM) 5 %  Place 1 patch onto the skin daily. Remove & Discard patch within 12 hours or as directed by MD    loratadine (CLARITIN) 10 MG tablet Take 10 mg by mouth daily as needed for allergies.    lubiprostone (AMITIZA) 8 MCG capsule Take 8 mcg by mouth every morning.    meloxicam (MOBIC) 7.5 MG tablet Take 7.5 mg by mouth daily.    nitroGLYCERIN (NITROSTAT) 0.4 MG SL tablet Place 0.4 mg under the tongue every 5 (five) minutes as needed for chest pain.    ondansetron (ZOFRAN) 4 MG tablet Take 4 mg by mouth every 8 (eight) hours as needed for nausea or vomiting.    ondansetron (ZOFRAN-ODT) 4 MG disintegrating tablet Take 4 mg by mouth every 8 (eight) hours as needed for nausea or vomiting.    pantoprazole (PROTONIX) 40 MG tablet Take 40 mg by mouth 2 (two) times daily.    phenazopyridine (PYRIDIUM) 100 MG tablet Take 100 mg by mouth 3 (three) times daily as needed for pain.    polyethylene glycol (MIRALAX / GLYCOLAX) packet Take  17 g by mouth daily.    pravastatin (PRAVACHOL) 40 MG tablet Take 40 mg by mouth daily.    ranitidine (ZANTAC) 150 MG tablet Take 150 mg by mouth daily.    sertraline (ZOLOFT) 100 MG tablet Take 150 mg by mouth daily.    Tiotropium Bromide Monohydrate 2.5 MCG/ACT AERS Inhale 2 puffs into the lungs daily.    zafirlukast (ACCOLATE) 20 MG tablet Take 20 mg by mouth 2 (two) times daily.      STOP taking these medications     potassium chloride (K-DUR) 10 MEQ tablet      predniSONE (DELTASONE) 20 MG tablet          DISCHARGE INSTRUCTIONS:    DIET:  Cardiac diet  DISCHARGE CONDITION:  Fair  ACTIVITY:  Activity as tolerated per physical therapy recommendations  OXYGEN:  Home Oxygen: No.   Oxygen Delivery: room air  DISCHARGE LOCATION:  nursing home skilled nursing facility for strength and balance  If you experience worsening of your admission symptoms, develop shortness of breath, life threatening emergency, suicidal or homicidal thoughts you must  seek medical attention immediately by calling 911 or calling your MD immediately  if symptoms less severe.  You Must read complete instructions/literature along with all the possible adverse reactions/side effects for all the Medicines you take and that have been prescribed to you. Take any new Medicines after you have completely understood and accpet all the possible adverse reactions/side effects.   Please note  You were cared for by a hospitalist during your hospital stay. If you have any questions about your discharge medications or the care you received while you were in the hospital after you are discharged, you can call the unit and asked to speak with the hospitalist on call if the hospitalist that took care of you is not available. Once you are discharged, your primary care physician will handle any further medical issues. Please note that NO REFILLS for any discharge medications will be authorized once you are discharged, as it is imperative that you return to your primary care physician (or establish a relationship with a primary care physician if you do not have one) for your aftercare needs so that they can reassess your need for medications and monitor your lab values.  Today   CHIEF COMPLAINT:   Doing well this morning. Tolerating regular diet. No further diarrhea and no abdominal pain. She feels ready for discharge.  HISTORY OF PRESENT ILLNESS:  HISTORY OF PRESENT ILLNESS: Melanie Wang is a 79 y.o. Melanie with a known history of AFib, atrial flutter with RVR, COPD, coronary artery disease, chronic renal failure, coronary artery disease, diabetes mellitus, Osteoarthritis, irritable bowel syndrome, leukopenia, thrombocytopenia, anxiety, anemia, obstructive sleep apnea on CPAP at home, hyperlipidemia , hypertension. For last 3 days had diarrhea and vomiting- not able to eat any since Friday. Felt very weak- seen by a doctor at senior care center today- they sent c diff, gave some IV  fluid and oral vancomycin- and sent her for direct admission. She denies abd pain, fever, chills.   VITAL SIGNS:  Blood pressure 129/54, pulse 73, temperature 97.8 F (36.6 C), temperature source Oral, resp. rate 18, height 5\' 4"  (1.626 m), weight 80.65 kg (177 lb 12.8 oz), SpO2 93 %.  I/O:   Intake/Output Summary (Last 24 hours) at 07/15/14 0754 Last data filed at 07/14/14 2200  Gross per 24 hour  Intake   1362 ml  Output    525 ml  Net  837 ml    PHYSICAL EXAMINATION:   Physical Exam  Constitutional: She is well-developed, well-nourished, and in no distress. No distress. obese. Eating lunch HENT: Head: Normocephalic and atraumatic. Edentulous. Neck: Neck supple. No JVD present. No tracheal deviation present.  Cardiovascular: Normal rate, regular rhythm and normal heart sounds. No mrg Pulmonary/Chest: Effort normal and breath sounds normal. No respiratory distress. She has no wheezes.  Abdominal: Soft. Slightly distended, BS +. There is no tenderness. There is no rebound.  Musculoskeletal: Normal range of motion. No edema Neurological: She is alert and oriented Skin: Skin is warm and dry. No erythema.  Psychiatric: Affect normal.   DATA REVIEW:   CBC  Recent Labs Lab 07/12/14 0517  WBC 5.5  HGB 12.0  HCT 36.1  PLT 110*    Chemistries   Recent Labs Lab 07/14/14 1546  NA 139  K 4.0  CL 104  CO2 26  GLUCOSE 116*  BUN 11  CREATININE 0.97  CALCIUM 8.2*    Cardiac Enzymes No results for input(s): TROPONINI in the last 168 hours.  Microbiology Results  Results for orders placed or performed during the hospital encounter of 07/07/14  C difficile quick scan w PCR reflex [facility])     Status: None   Collection Time: 07/07/14  5:45 PM  Result Value Ref Range Status   C Diff antigen NEGATIVE  Final   C Diff toxin NEGATIVE  Final   C Diff interpretation Negative for C. difficile  Final  Stool culture     Status: None (Preliminary result)   Collection Time:  07/08/14  4:33 AM  Result Value Ref Range Status   Specimen Description STOOL  Final   Special Requests Normal  Final   Culture   Final    SALMONELLA SPECIES No Pathogenic E. coli detected NO CAMPYLOBACTER DETECTED SENT TO STATE FOR IDENTIFICATION Results Called to: KRISTA YOUNG ON 07/12/14 AT 1145AM BY JEF    Report Status PENDING  Incomplete    RADIOLOGY:  No results found.  EKG:  No orders found for this or any previous visit.    Management plans discussed with the patient, family and they are in agreement.  CODE STATUS:     Code Status Orders        Start     Ordered   07/07/14 1852  Do not attempt resuscitation (DNR)   Continuous    Question Answer Comment  In the event of cardiac or respiratory ARREST Do not call a "code blue"   In the event of cardiac or respiratory ARREST Do not perform Intubation, CPR, defibrillation or ACLS   In the event of cardiac or respiratory ARREST Use medication by any route, position, wound care, and other measures to relive pain and suffering. May use oxygen, suction and manual treatment of airway obstruction as needed for comfort.      07/07/14 1851    Advance Directive Documentation        Most Recent Value   Type of Advance Directive  Healthcare Power of Attorney, Living will   Pre-existing out of facility DNR order (yellow form or pink MOST form)     "MOST" Form in Place?        TOTAL TIME TAKING CARE OF THIS PATIENT: 45 minutes.    Elby Showers M.D on 07/15/2014 at 7:54 AM  Between 7am to 6pm - Pager - [phone]  After 6pm go to [email or web address] - password [facility] Hospital[facility]  Office  [phone]  CC: Primary care physician; No PCP Per Patient

## 2014-07-15 NOTE — Plan of Care (Signed)
Problem: Discharge Progression Outcomes Goal: Other Discharge Outcomes/Goals Outcome: Progressing Plan of care progress to goals: 1. Hemodynamically:                -VSS             -IVFs d/c'd                -no n/v or diarrhea during the night         2. Tolerating soft food diet well    3. Ambulates with walker and standby assistance. Up in chair with assistance. 4. Plan for HHPT, pt of [facility]. No fall or injuries this shift. 5. Pt has no IV access. MD aware

## 2014-07-15 NOTE — Clinical Social Work Note (Signed)
[facility]. Pt is agreeable to discharge plan. Facility has received discharge information and is ready to admit pt. RN called report and [facility] provided transportation. CSW is signing off as no further needs identified.   Dede QuerySarah Carry Ortez, MSW, LCSW Clinical Social Worker  [phone]

## 2014-07-25 LAB — SURGICAL PATHOLOGY

## 2014-08-25 LAB — STOOL CULTURE: Special Requests: NORMAL

## 2015-11-15 ENCOUNTER — Encounter: Payer: Self-pay | Admitting: Emergency Medicine

## 2015-11-15 ENCOUNTER — Emergency Department
Admission: EM | Admit: 2015-11-15 | Discharge: 2015-11-15 | Disposition: A | Discharge status: Home / Self Care | Payer: Medicare (Managed Care) | Attending: Emergency Medicine | Admitting: Emergency Medicine

## 2015-11-15 DIAGNOSIS — Z79899 Other long term (current) drug therapy: Secondary | ICD-10-CM | POA: Insufficient documentation

## 2015-11-15 DIAGNOSIS — Z87891 Personal history of nicotine dependence: Secondary | ICD-10-CM | POA: Insufficient documentation

## 2015-11-15 DIAGNOSIS — N390 Urinary tract infection, site not specified: Secondary | ICD-10-CM | POA: Diagnosis not present

## 2015-11-15 DIAGNOSIS — Z791 Long term (current) use of non-steroidal anti-inflammatories (NSAID): Secondary | ICD-10-CM | POA: Insufficient documentation

## 2015-11-15 DIAGNOSIS — E119 Type 2 diabetes mellitus without complications: Secondary | ICD-10-CM | POA: Insufficient documentation

## 2015-11-15 DIAGNOSIS — I10 Essential (primary) hypertension: Secondary | ICD-10-CM | POA: Insufficient documentation

## 2015-11-15 DIAGNOSIS — Z7982 Long term (current) use of aspirin: Secondary | ICD-10-CM | POA: Insufficient documentation

## 2015-11-15 DIAGNOSIS — R102 Pelvic and perineal pain: Secondary | ICD-10-CM | POA: Diagnosis present

## 2015-11-15 DIAGNOSIS — J449 Chronic obstructive pulmonary disease, unspecified: Secondary | ICD-10-CM | POA: Insufficient documentation

## 2015-11-15 LAB — URINALYSIS COMPLETE WITH MICROSCOPIC (ARMC ONLY)
Bilirubin Urine: NEGATIVE
Glucose, UA: NEGATIVE mg/dL
KETONES UR: NEGATIVE mg/dL
Nitrite: NEGATIVE
PH: 7 (ref 5.0–8.0)
PROTEIN: 100 mg/dL — AB
Specific Gravity, Urine: 1.013 (ref 1.005–1.030)

## 2015-11-15 MED ORDER — ACETAMINOPHEN 500 MG PO TABS
ORAL_TABLET | ORAL | Status: AC
  Administered 2015-11-15: 1000 mg via ORAL
  Filled 2015-11-15: qty 2

## 2015-11-15 MED ORDER — NITROFURANTOIN MONOHYD MACRO 100 MG PO CAPS: 100.0000 mg | ORAL_CAPSULE | Freq: Two times a day (BID) | ORAL | 0 refills | Status: AC

## 2015-11-15 MED ORDER — PHENAZOPYRIDINE HCL 200 MG PO TABS: 200.0000 mg | ORAL_TABLET | Freq: Three times a day (TID) | ORAL | 0 refills | Status: AC | PRN

## 2015-11-15 MED ORDER — CEPHALEXIN 500 MG PO CAPS: 500.0000 mg | ORAL_CAPSULE | Freq: Four times a day (QID) | ORAL | 0 refills | Status: AC

## 2015-11-15 MED ORDER — PHENAZOPYRIDINE HCL 200 MG PO TABS
ORAL_TABLET | ORAL | Status: AC
  Administered 2015-11-15: 200 mg via ORAL
  Filled 2015-11-15: qty 1

## 2015-11-15 MED ORDER — TRAMADOL HCL 50 MG PO TABS: 50.0000 mg | ORAL_TABLET | Freq: Four times a day (QID) | ORAL | 0 refills | Status: AC | PRN

## 2015-11-15 MED ORDER — PHENAZOPYRIDINE HCL 200 MG PO TABS
200.0000 mg | ORAL_TABLET | Freq: Once | ORAL | Status: AC
  Administered 2015-11-15: 200 mg via ORAL

## 2015-11-15 MED ORDER — CEFTRIAXONE SODIUM 1 G IJ SOLR
1.0000 g | INTRAMUSCULAR | Status: DC
  Administered 2015-11-15: 1 g via INTRAMUSCULAR

## 2015-11-15 MED ORDER — ACETAMINOPHEN 500 MG PO TABS
1000.0000 mg | ORAL_TABLET | Freq: Once | ORAL | Status: AC
  Administered 2015-11-15: 1000 mg via ORAL

## 2015-11-15 MED ORDER — CEFTRIAXONE SODIUM 1 G IJ SOLR
INTRAMUSCULAR | Status: AC
  Administered 2015-11-15: 1 g via INTRAMUSCULAR
  Filled 2015-11-15: qty 10

## 2015-11-15 NOTE — ED Provider Notes (Signed)
[facility] Emergency Department Provider Note        Time seen: ----------------------------------------- 6:25 PM on 11/15/2015 -----------------------------------------    I have reviewed the triage vital signs and the nursing notes.   HISTORY  Chief Complaint Pelvic Pain    HPI Melanie Wang is a 80 y.o. female who presents to ER complaining of pelvic pain that started last night. Patient states she was just [facility] about a week ago was told she had bacterial infections in her urine and that she needed to have anotherurine sample tested. Patient was not given antibiotics for UTI and no follow-up was arranged. Patient is describing polyuria and dysuria. She denies fevers, chills, back pain or other complaints.   Past Medical History:  Diagnosis Date  . Allergy   . Anemia   . Arthritis   . COPD (chronic obstructive pulmonary disease) (HCC)   . Diabetes mellitus without complication (HCC)   . GERD (gastroesophageal reflux disease)   . Hypertension     Patient Active Problem List   Diagnosis Date Noted  . Salmonella dysentery 07/15/2014  . Dehydration 07/07/2014    Past Surgical History:  Procedure Laterality Date  . ABDOMINAL HYSTERECTOMY    . ANTERIOR AND POSTERIOR VAGINAL REPAIR    . bladder tack    . ESOPHAGOGASTRODUODENOSCOPY Left 07/11/2014   Procedure: Place tube in throat and evaluate esophagus and stomach for source of nausea. Dilate esophagus if stricture present.;  Surgeon: Wallace CullensPaul Y Oh, MD;  [facility] END[facility];  Service: Endoscopy;  Laterality: Left;  . tympanoplasty      Allergies Flagyl [metronidazole]; Gabapentin; Glucophage [metformin hcl]; Plavix [clopidogrel]; Shellfish allergy; Synthroid [levothyroxine]; Naprosyn [naproxen]; and Sulfa antibiotics  Social History Social History  Substance Use Topics  . Smoking status: Former Games developermoker  . Smokeless tobacco: Never Used  . Alcohol use No    Review  of Systems Constitutional: Negative for fever. Cardiovascular: Negative for chest pain. Respiratory: Negative for shortness of breath. Gastrointestinal: Negative for abdominal pain, vomiting and diarrhea. Genitourinary: Positive for dysuria, polyuria Musculoskeletal: Negative for back pain. Skin: Negative for rash. Neurological: Negative for headaches, focal weakness or numbness.  10-point ROS otherwise negative.  ____________________________________________   PHYSICAL EXAM:  VITAL SIGNS: ED Triage Vitals  Enc Vitals Group     BP 11/15/15 1644 (!) 144/53     Pulse Rate 11/15/15 1644 69     Resp 11/15/15 1644 18     Temp 11/15/15 1644 97.7 F (36.5 C)     Temp Source 11/15/15 1644 Oral     SpO2 11/15/15 1644 98 %     Weight 11/15/15 1647 180 lb (81.6 kg)     Height 11/15/15 1647 5\' 4"  (1.626 m)     Head Circumference --      Peak Flow --      Pain Score 11/15/15 1647 10     Pain Loc --      Pain Edu? --      Excl. in GC? --     Constitutional: Alert and oriented. Well appearing and in no distress. Eyes: Conjunctivae are normal. PERRL. Normal extraocular movements. ENT   Head: Normocephalic and atraumatic.   Nose: No congestion/rhinnorhea.   Mouth/Throat: Mucous membranes are moist.   Neck: No stridor. Cardiovascular: Normal rate, regular rhythm. No murmurs, rubs, or gallops. Respiratory: Normal respiratory effort without tachypnea nor retractions. Breath sounds are clear and equal bilaterally. No wheezes/rales/rhonchi. Gastrointestinal: Mild suprapubic tenderness, no rebound  or guarding. Normal bowel sounds. Musculoskeletal: Nontender with normal range of motion in all extremities. No lower extremity tenderness nor edema. Neurologic:  Normal speech and language. No gross focal neurologic deficits are appreciated.  Skin:  Skin is warm, dry and intact. No rash noted. Psychiatric: Mood and affect are normal. Speech and behavior are normal.   ____________________________________________  ED COURSE:  Pertinent labs & imaging results that were available during my care of the patient were reviewed by me and considered in my medical decision making (see chart for details). Clinical Course  Patient is in no distress, has symptoms of UTI. We will assess with urinalysis and urine culture.  Procedures ____________________________________________   LABS (pertinent positives/negatives)  Labs Reviewed  URINALYSIS COMPLETEWITH MICROSCOPIC ([facility] ONLY) - Abnormal; Notable for the following:       Result Value   Color, Urine YELLOW (*)    APPearance CLOUDY (*)    Hgb urine dipstick 1+ (*)    Protein, ur 100 (*)    Leukocytes, UA 3+ (*)    Bacteria, UA MANY (*)    Squamous Epithelial / LPF 0-5 (*)    All other components within normal limits  URINE CULTURE   ____________________________________________  FINAL ASSESSMENT AND PLAN  Cystitis  Plan: Patient with labs as dictated above. Patient was given 1 g of Rocephin IM, we have sent a urine culture. She was given Pyridium as well as Tylenol here because she was driving. She'll be discharged with tramadol Pyridium, Keflex and Macrobid to take concurrently. She is stable for outpatient follow-up with her doctor.   Emily Filbert, MD   Note: This dictation was prepared with Dragon dictation. Any transcriptional errors that result from this process are unintentional    Emily Filbert, MD 11/15/15 [phone]

## 2015-11-15 NOTE — ED Notes (Signed)

## 2015-11-15 NOTE — ED Triage Notes (Signed)
Pt arrived to ED with c/o of pelvic pain that started last night. Pt states [facility] and was told she had 2 bacterial infections and that she needed to have another urine sample tested/

## 2015-11-18 LAB — URINE CULTURE
Culture: >=100000 — AB
Special Requests: NORMAL

## 2016-06-21 DEATH — deceased
